# Patient Record
Sex: Male | Born: 1965 | Race: Black or African American | Hispanic: No | Marital: Married | Smoking: Current every day smoker
Health system: Southern US, Community
[De-identification: ages and names within clinical notes are randomized; demographics above are authoritative.]

## PROBLEM LIST (undated history)

## (undated) DIAGNOSIS — I10 Essential (primary) hypertension: Secondary | ICD-10-CM

## (undated) DIAGNOSIS — M549 Dorsalgia, unspecified: Secondary | ICD-10-CM

## (undated) HISTORY — PX: HERNIA REPAIR: SHX51

## (undated) HISTORY — PX: KNEE SURGERY: SHX244

---

## 2017-09-12 ENCOUNTER — Encounter (HOSPITAL_COMMUNITY): Payer: Self-pay | Admitting: Radiology

## 2017-09-12 ENCOUNTER — Emergency Department (HOSPITAL_COMMUNITY): Payer: Medicaid Other

## 2017-09-12 ENCOUNTER — Inpatient Hospital Stay (HOSPITAL_COMMUNITY)
Admission: EM | Admit: 2017-09-12 | Discharge: 2017-09-13 | DRG: 305 | Discharge status: Against Medical Advice | Payer: Medicaid Other | Attending: Internal Medicine | Admitting: Internal Medicine

## 2017-09-12 ENCOUNTER — Other Ambulatory Visit: Payer: Self-pay

## 2017-09-12 ENCOUNTER — Inpatient Hospital Stay (HOSPITAL_COMMUNITY): Payer: Medicaid Other

## 2017-09-12 DIAGNOSIS — R519 Headache, unspecified: Secondary | ICD-10-CM

## 2017-09-12 DIAGNOSIS — F1210 Cannabis abuse, uncomplicated: Secondary | ICD-10-CM | POA: Diagnosis present

## 2017-09-12 DIAGNOSIS — R079 Chest pain, unspecified: Secondary | ICD-10-CM | POA: Diagnosis present

## 2017-09-12 DIAGNOSIS — I160 Hypertensive urgency: Secondary | ICD-10-CM | POA: Diagnosis not present

## 2017-09-12 DIAGNOSIS — I5030 Unspecified diastolic (congestive) heart failure: Secondary | ICD-10-CM

## 2017-09-12 DIAGNOSIS — F1490 Cocaine use, unspecified, uncomplicated: Secondary | ICD-10-CM

## 2017-09-12 DIAGNOSIS — Z87898 Personal history of other specified conditions: Secondary | ICD-10-CM

## 2017-09-12 DIAGNOSIS — I161 Hypertensive emergency: Principal | ICD-10-CM | POA: Diagnosis present

## 2017-09-12 DIAGNOSIS — Z5321 Procedure and treatment not carried out due to patient leaving prior to being seen by health care provider: Secondary | ICD-10-CM | POA: Diagnosis not present

## 2017-09-12 DIAGNOSIS — N200 Calculus of kidney: Secondary | ICD-10-CM | POA: Diagnosis present

## 2017-09-12 DIAGNOSIS — R109 Unspecified abdominal pain: Secondary | ICD-10-CM

## 2017-09-12 DIAGNOSIS — I77819 Aortic ectasia, unspecified site: Secondary | ICD-10-CM

## 2017-09-12 DIAGNOSIS — N179 Acute kidney failure, unspecified: Secondary | ICD-10-CM | POA: Diagnosis present

## 2017-09-12 DIAGNOSIS — F1410 Cocaine abuse, uncomplicated: Secondary | ICD-10-CM | POA: Diagnosis present

## 2017-09-12 DIAGNOSIS — F1290 Cannabis use, unspecified, uncomplicated: Secondary | ICD-10-CM | POA: Diagnosis not present

## 2017-09-12 DIAGNOSIS — R911 Solitary pulmonary nodule: Secondary | ICD-10-CM

## 2017-09-12 DIAGNOSIS — Z801 Family history of malignant neoplasm of trachea, bronchus and lung: Secondary | ICD-10-CM | POA: Diagnosis not present

## 2017-09-12 DIAGNOSIS — F17210 Nicotine dependence, cigarettes, uncomplicated: Secondary | ICD-10-CM | POA: Diagnosis present

## 2017-09-12 DIAGNOSIS — Z720 Tobacco use: Secondary | ICD-10-CM

## 2017-09-12 DIAGNOSIS — I700 Atherosclerosis of aorta: Secondary | ICD-10-CM

## 2017-09-12 DIAGNOSIS — F1910 Other psychoactive substance abuse, uncomplicated: Secondary | ICD-10-CM | POA: Insufficient documentation

## 2017-09-12 DIAGNOSIS — R51 Headache: Secondary | ICD-10-CM

## 2017-09-12 DIAGNOSIS — R072 Precordial pain: Secondary | ICD-10-CM

## 2017-09-12 LAB — URINALYSIS, ROUTINE W REFLEX MICROSCOPIC
BILIRUBIN URINE: NEGATIVE
Bacteria, UA: NONE SEEN
Glucose, UA: NEGATIVE mg/dL
Hgb urine dipstick: NEGATIVE
Ketones, ur: 20 mg/dL — AB
Leukocytes, UA: NEGATIVE
Nitrite: NEGATIVE
PH: 9 — AB (ref 5.0–8.0)
Protein, ur: 30 mg/dL — AB
SPECIFIC GRAVITY, URINE: 1.019 (ref 1.005–1.030)

## 2017-09-12 LAB — COMPREHENSIVE METABOLIC PANEL
ALBUMIN: 4.3 g/dL (ref 3.5–5.0)
ALK PHOS: 58 U/L (ref 38–126)
ALT: 23 U/L (ref 17–63)
ANION GAP: 15 (ref 5–15)
AST: 27 U/L (ref 15–41)
BILIRUBIN TOTAL: 0.8 mg/dL (ref 0.3–1.2)
BUN: 15 mg/dL (ref 6–20)
CALCIUM: 10 mg/dL (ref 8.9–10.3)
CO2: 23 mmol/L (ref 22–32)
Chloride: 104 mmol/L (ref 101–111)
Creatinine, Ser: 1.27 mg/dL — ABNORMAL HIGH (ref 0.61–1.24)
GFR calc Af Amer: >60 mL/min (ref >60)
GLUCOSE: 142 mg/dL — AB (ref 65–99)
Potassium: 3.5 mmol/L (ref 3.5–5.1)
Sodium: 142 mmol/L (ref 135–145)
TOTAL PROTEIN: 7.4 g/dL (ref 6.5–8.1)

## 2017-09-12 LAB — CBC
HCT: 47.9 % (ref 39.0–52.0)
Hemoglobin: 15.8 g/dL (ref 13.0–17.0)
MCH: 27.3 pg (ref 26.0–34.0)
MCHC: 33 g/dL (ref 30.0–36.0)
MCV: 82.9 fL (ref 78.0–100.0)
PLATELETS: 256 10*3/uL (ref 150–400)
RBC: 5.78 MIL/uL (ref 4.22–5.81)
RDW: 14.7 % (ref 11.5–15.5)
WBC: 8.4 10*3/uL (ref 4.0–10.5)

## 2017-09-12 LAB — RAPID URINE DRUG SCREEN, HOSP PERFORMED
Amphetamines: NOT DETECTED
BARBITURATES: NOT DETECTED
BENZODIAZEPINES: NOT DETECTED
COCAINE: POSITIVE — AB
Opiates: NOT DETECTED
TETRAHYDROCANNABINOL: NOT DETECTED

## 2017-09-12 LAB — TROPONIN I
Troponin I: <0.03 ng/mL (ref <0.03)
Troponin I: <0.03 ng/mL (ref <0.03)

## 2017-09-12 LAB — ECHOCARDIOGRAM COMPLETE

## 2017-09-12 LAB — I-STAT TROPONIN, ED: Troponin i, poc: 0.01 ng/mL (ref 0.00–0.08)

## 2017-09-12 MED ORDER — MORPHINE SULFATE (PF) 4 MG/ML IV SOLN
4.0000 mg | Freq: Once | INTRAVENOUS | Status: AC
  Administered 2017-09-12: 4 mg via INTRAVENOUS
  Filled 2017-09-12: qty 1

## 2017-09-12 MED ORDER — NITROGLYCERIN IN D5W 200-5 MCG/ML-% IV SOLN: 0.0000 ug/min | INTRAVENOUS | Status: DC

## 2017-09-12 MED ORDER — ONDANSETRON HCL 4 MG/2ML IJ SOLN
4.0000 mg | Freq: Once | INTRAMUSCULAR | Status: AC
  Administered 2017-09-12: 4 mg via INTRAVENOUS
  Filled 2017-09-12: qty 2

## 2017-09-12 MED ORDER — SODIUM CHLORIDE 0.9 % IV SOLN
INTRAVENOUS | Status: AC
  Administered 2017-09-12: 14:00:00 via INTRAVENOUS

## 2017-09-12 MED ORDER — ACETAMINOPHEN 650 MG RE SUPP: 650.0000 mg | Freq: Four times a day (QID) | RECTAL | Status: DC | PRN

## 2017-09-12 MED ORDER — ACETAMINOPHEN 325 MG PO TABS: 650.0000 mg | ORAL_TABLET | Freq: Four times a day (QID) | ORAL | Status: DC | PRN

## 2017-09-12 MED ORDER — NITROGLYCERIN 0.4 MG SL SUBL: 0.4000 mg | SUBLINGUAL_TABLET | SUBLINGUAL | Status: DC | PRN

## 2017-09-12 MED ORDER — ENOXAPARIN SODIUM 40 MG/0.4ML ~~LOC~~ SOLN
40.0000 mg | SUBCUTANEOUS | Status: DC
  Administered 2017-09-12: 40 mg via SUBCUTANEOUS
  Filled 2017-09-12 (×2): qty 0.4

## 2017-09-12 MED ORDER — DICLOFENAC SODIUM 1 % TD GEL
4.0000 g | Freq: Four times a day (QID) | TRANSDERMAL | Status: DC
  Administered 2017-09-12: 17:00:00 4 g via TOPICAL
  Filled 2017-09-12: qty 100

## 2017-09-12 MED ORDER — KETOROLAC TROMETHAMINE 30 MG/ML IJ SOLN
30.0000 mg | Freq: Four times a day (QID) | INTRAMUSCULAR | Status: DC | PRN
  Administered 2017-09-12: 30 mg via INTRAVENOUS
  Filled 2017-09-12: qty 1

## 2017-09-12 MED ORDER — IOPAMIDOL (ISOVUE-370) INJECTION 76%
INTRAVENOUS | Status: AC
  Administered 2017-09-12: 100 mL
  Filled 2017-09-12: qty 100

## 2017-09-12 MED ORDER — NITROGLYCERIN IN D5W 200-5 MCG/ML-% IV SOLN
0.0000 ug/min | Freq: Once | INTRAVENOUS | Status: AC
  Administered 2017-09-12: 5 ug/min via INTRAVENOUS
  Filled 2017-09-12: qty 250

## 2017-09-12 MED ORDER — POLYETHYLENE GLYCOL 3350 17 G PO PACK
17.0000 g | PACK | Freq: Every day | ORAL | Status: DC | PRN
Start: 2017-09-12 — End: 2017-09-13

## 2017-09-12 MED ORDER — AMLODIPINE BESYLATE 5 MG PO TABS: 10.0000 mg | ORAL_TABLET | Freq: Every day | ORAL | Status: DC

## 2017-09-12 MED ORDER — ONDANSETRON HCL 4 MG/2ML IJ SOLN: 4.0000 mg | Freq: Three times a day (TID) | INTRAMUSCULAR | Status: DC | PRN

## 2017-09-12 NOTE — ED Notes (Signed)
Pt requesting pain medication for his stomach spasms.

## 2017-09-12 NOTE — ED Triage Notes (Signed)
[facility] with complains of SSCP with right side pain and headache with vomiting. Has saline lock left hand. States that he has this pain intermittently-will not answer questions and will not follow commands

## 2017-09-12 NOTE — ED Provider Notes (Signed)
[facility] EMERGENCY DEPARTMENT Provider Note   CSN: [id number] Arrival date & time: 09/12/17  0707     History   Chief Complaint No chief complaint on file.   HPI Jonathon Obrien is a 52 y.o. male.  Patient with history of hypertension presents the emergency department with complaint of acute onset severe mid chest pain, right flank pain, and headache at approximately 1 AM today.  Patient appears distressed and is a vague historian.  He denies any drugs or alcohol, including cocaine.  He does state that he vomited.  No urinary changes.  No treatments prior to arrival.  Does report drinking alcohol about a month ago, denies current use. The course is constant. Aggravating factors: none. Alleviating factors: none.   Additional history from wife who arrived later: Reports they moved from [place] about a year ago. H/o HTN and similar sx in past, hospitalized in [place].       History reviewed. No pertinent past medical history.  There are no active problems to display for this patient.   The histories are not reviewed yet. Please review them in the "History" navigator section and refresh this SmartLink.      Home Medications    Prior to Admission medications   Not on File    Family History No family history on file.  Social History Social History   Tobacco Use  . Smoking status: Not on file  Substance Use Topics  . Alcohol use: Not on file  . Drug use: Not on file     Allergies   Patient has no allergy information on record.   Review of Systems Review of Systems  Constitutional: Negative for diaphoresis and fever.  Eyes: Negative for redness.  Respiratory: Negative for cough and shortness of breath.   Cardiovascular: Positive for chest pain. Negative for palpitations and leg swelling.  Gastrointestinal: Positive for abdominal pain, nausea and vomiting.  Genitourinary: Positive for flank pain. Negative for dysuria.    Musculoskeletal: Negative for back pain and neck pain.  Skin: Negative for rash.  Neurological: Positive for headaches. Negative for syncope and light-headedness.  Psychiatric/Behavioral: The patient is not nervous/anxious.      Physical Exam Updated Vital Signs BP (!) 206/122 (BP Location: Left Arm)   Pulse 86   Resp (!) 22   SpO2 100%   Physical Exam  Constitutional: He appears well-developed and well-nourished. He appears distressed.  Patient appears distressed.  Answers questions with brief responses.  Does not volunteer history.  HENT:  Head: Normocephalic and atraumatic.  Mouth/Throat: Oropharynx is clear and moist and mucous membranes are normal. Mucous membranes are not dry.  Eyes: Conjunctivae are normal.  Neck: Trachea normal and normal range of motion. Neck supple. Normal carotid pulses and no JVD present. No muscular tenderness present. Carotid bruit is not present. No tracheal deviation present.  Cardiovascular: Normal rate, regular rhythm, S1 normal, S2 normal, normal heart sounds and intact distal pulses. Exam reveals no distant heart sounds and no decreased pulses.  No murmur heard. Pulses:      Radial pulses are 2+ on the right side, and 2+ on the left side.       Dorsalis pedis pulses are 2+ on the right side, and 2+ on the left side.  Pulmonary/Chest: Effort normal and breath sounds normal. No respiratory distress. He has no wheezes. He exhibits no tenderness.  Abdominal: Soft. Normal aorta and bowel sounds are normal. There is tenderness (Right flank.). There is no  rebound and no guarding.  Musculoskeletal: He exhibits no edema.  No lower extremity edema.  Neurological: He is alert.  Skin: Skin is warm and dry. He is not diaphoretic. No cyanosis. No pallor.  Psychiatric: He has a normal mood and affect.  Nursing note and vitals reviewed.    ED Treatments / Results  Labs (all labs ordered are listed, but only abnormal results are displayed) Labs Reviewed   COMPREHENSIVE METABOLIC PANEL - Abnormal; Notable for the following components:      Result Value   Glucose, Bld 142 (*)    Creatinine, Ser 1.27 (*)    All other components within normal limits  URINALYSIS, ROUTINE W REFLEX MICROSCOPIC - Abnormal; Notable for the following components:   pH 9.0 (*)    Ketones, ur 20 (*)    Protein, ur 30 (*)    Squamous Epithelial / LPF 0-5 (*)    All other components within normal limits  RAPID URINE DRUG SCREEN, HOSP PERFORMED - Abnormal; Notable for the following components:   Cocaine POSITIVE (*)    All other components within normal limits  CBC  I-STAT TROPONIN, ED    EKG EKG Interpretation  Date/Time:  Saturday September 12 2017 07:16:32 EDT Ventricular Rate:  80 PR Interval:  174 QRS Duration: 102 QT Interval:  420 QTC Calculation: 484 R Axis:   56 Text Interpretation:  Normal sinus rhythm Possible Left atrial enlargement RSR' or QR pattern in V1 suggests right ventricular conduction delay Left ventricular hypertrophy Nonspecific T wave abnormality Prolonged QT Abnormal ECG No old tracing to compare Confirmed by LisbonJacubowitz, Doreatha MartinSam [phone]([id number]) on 09/12/2017 7:48:36 AM  ED ECG REPORT   Date: 09/12/2017  Rate: 73  Rhythm: normal sinus rhythm  QRS Axis: normal  Intervals: normal  ST/T Wave abnormalities: nonspecific ST/T changes  Conduction Disutrbances:none  Narrative Interpretation:   Old EKG Reviewed: unchanged  I have personally reviewed the EKG tracing and agree with the computerized printout as noted.    Radiology Ct Head Wo Contrast  Result Date: 09/12/2017 CLINICAL DATA:  Headache with vomiting. EXAM: CT HEAD WITHOUT CONTRAST TECHNIQUE: Contiguous axial images were obtained from the base of the skull through the vertex without intravenous contrast. COMPARISON:  None. FINDINGS: Brain: No evidence of acute infarction, hemorrhage, hydrocephalus, extra-axial collection or mass lesion/mass effect. Dural calcifications about the bilateral  cerebral convexity, nonspecific. Vascular: No hyperdense vessel or unexpected calcification. Skull: Normal. Negative for fracture or focal lesion. Sinuses/Orbits: No acute finding. IMPRESSION: No acute finding.  No explanation for headache. Electronically Signed   By: Marnee SpringJonathon  Watts M.D.   On: 09/12/2017 09:21   Dg Chest Portable 1 View  Result Date: 09/12/2017 CLINICAL DATA:  Chest pain and flank pain. EXAM: PORTABLE CHEST 1 VIEW COMPARISON:  None. FINDINGS: The study is limited due to patient positioning and a prominent skin fold over lateral left base. No pneumothorax. The heart size borderline. The right hilum is obscured. The left hilum is normal. The mediastinum is unremarkable given technique. No nodules, masses, or focal infiltrates. IMPRESSION: Limited study due to patient positioning. No acute abnormality identified. Electronically Signed   By: Gerome Samavid  Williams III M.D   On: 09/12/2017 08:13   Ct Angio Chest/abd/pel For Dissection W And/or Wo Contrast  Result Date: 09/12/2017 CLINICAL DATA:  Evaluate for aortic dissection. EXAM: CT ANGIOGRAPHY CHEST, ABDOMEN AND PELVIS TECHNIQUE: Multidetector CT imaging through the chest, abdomen and pelvis was performed using the standard protocol during bolus administration of intravenous contrast. Multiplanar  reconstructed images and MIPs were obtained and reviewed to evaluate the vascular anatomy. CONTRAST:  ISOVUE-370 IOPAMIDOL (ISOVUE-370) INJECTION 76% COMPARISON:  None. FINDINGS: CTA CHEST FINDINGS Cardiovascular: The heart size is normal. No pericardial effusion. Mild aortic atherosclerosis. The ascending thoracic aorta measures 3.9 cm. The anterior arch measures 3.2 cm in diameter. Calcification noted within the left circumflex coronary artery. No evidence for aortic dissection. The main pulmonary artery is increased in diameter measuring 3.9 cm. No central obstructing pulmonary embolus identified. Mediastinum/Nodes: No enlarged mediastinal, hilar,  or axillary lymph nodes. Thyroid gland, trachea, and esophagus demonstrate no significant findings. Lungs/Pleura: No pleural effusion. No airspace consolidation, atelectasis or pneumothorax. 3 mm right middle lobe nodule identified, image 87/6. Musculoskeletal: No chest wall abnormality. No acute or significant osseous findings. Review of the MIP images confirms the above findings. CTA ABDOMEN AND PELVIS FINDINGS VASCULAR Aorta: Aortic atherosclerosis identified. No significant branch vessel disease. No aneurysm. No evidence for aortic dissection. Celiac: Patent without evidence of aneurysm, dissection, vasculitis or significant stenosis. SMA: Patent without evidence of aneurysm, dissection, vasculitis or significant stenosis. Renals: Both renal arteries are patent without evidence of aneurysm, dissection, vasculitis, fibromuscular dysplasia or significant stenosis. IMA: Patent without evidence of aneurysm, dissection, vasculitis or significant stenosis. Inflow: Patent without evidence of aneurysm, dissection, vasculitis or significant stenosis. Veins: No obvious venous abnormality within the limitations of this arterial phase study. Review of the MIP images confirms the above findings. NON-VASCULAR Hepatobiliary: Normal appearance of the liver. The gallbladder is unremarkable. No intrahepatic biliary dilatation. Mild increase caliber of the common bile duct which measures up to 9 mm. Pancreas: There is a focal area of relative hypoenhancement at the level of the head of pancreas measuring 2.1 cm, image 36/9. No pancreatic ductal dilatation or inflammation. Spleen: Normal in size without focal abnormality. Adrenals/Urinary Tract: The adrenal glands are unremarkable. Stone within the lower pole of left kidney measures 1 cm. No hydronephrosis identified. Urinary bladder appears normal. Stomach/Bowel: Stomach is within normal limits. No evidence of bowel wall thickening, distention, or inflammatory changes. Lymphatic:  No adenopathy identified within the upper abdomen. There is no pelvic or inguinal adenopathy. Reproductive: Prostate is unremarkable. Other: No free fluid or fluid collections identified. Musculoskeletal: No acute or significant osseous findings. Review of the MIP images confirms the above findings. IMPRESSION: 1. No evidence for aortic dissection. 2. The ascending thoracic aorta is ectatic. The anterior arch of the aorta measures 3.2 cm in diameter. Recommend annual imaging followup by CTA or MRA. This recommendation follows 2010 ACCF/AHA/AATS/ACR/ASA/SCA/SCAI/SIR/STS/SVM Guidelines for the Diagnosis and Management of Patients with Thoracic Aortic Disease. Circulation.2010; 121: U045-W098 3.  Aortic Atherosclerosis (ICD10-I70.0). 4. There is a vague area of low attenuation within the head of pancreas. This is indeterminate. Further investigation with nonemergent pancreas protocol MRI is advised to exclude underlying pancreas neoplasm. 5. Nonobstructing left renal calculus. 6. 3 mm right middle lobe pulmonary nodule. No follow-up needed if patient is low-risk. Non-contrast chest CT can be considered in 12 months if patient is high-risk. This recommendation follows the consensus statement: Guidelines for Management of Incidental Pulmonary Nodules Detected on CT Images: From the Fleischner Society 2017; Radiology 2017; 284:228-243. Electronically Signed   By: Signa Kell M.D.   On: 09/12/2017 09:32    Procedures Procedures (including critical care time)  Medications Ordered in ED Medications  nitroGLYCERIN (NITROSTAT) SL tablet 0.4 mg (has no administration in time range)  nitroGLYCERIN 50 mg in dextrose 5 % 250 mL (0.2 mg/mL) infusion (20  mcg/min Intravenous Rate/Dose Change 09/12/17 0940)  morphine 4 MG/ML injection 4 mg (4 mg Intravenous Given 09/12/17 0806)  ondansetron (ZOFRAN) injection 4 mg (4 mg Intravenous Given 09/12/17 0806)  iopamidol (ISOVUE-370) 76 % injection (100 mLs  Contrast Given 09/12/17  0844)  morphine 4 MG/ML injection 4 mg (4 mg Intravenous Given 09/12/17 1610)     Initial Impression / Assessment and Plan / ED Course  I have reviewed the triage vital signs and the nursing notes.  Pertinent labs & imaging results that were available during my care of the patient were reviewed by me and considered in my medical decision making (see chart for details).     Patient seen and examined. EKG reviewed. Work-up initiated. Medications ordered. Discussed with Dr. Ethelda Chick who has seen patient.   Vital signs reviewed and are as follows: BP (!) 230/120 (BP Location: Left Arm)   Pulse 86   Resp (!) 25   SpO2 100%   Clinically patient has hypertensive emergency.  Given severe headache and vomiting, will need head CT.  Given severe chest and flank pain, will need imaging to rule out dissection.  Patient started on nitroglycerin drip to control blood pressure which is measured at 230/120 manually.  8:21 AM Pt rechecked. Now more comfortable. NTG drip running, BP improving.   Wife now at bedside. She corroborates patient's history.  States that they moved to the area from [place] almost a year ago.  He has not been on any medication since that time but has required medications in the past.  Similar episode of pain and admission to the hospital in [place] in the past year or 2.  Repeat EKG reviewed with Dr. Ethelda Chick.  BP (!) 202/113   Pulse 86   Temp 97.6 F (36.4 C) (Oral)   Resp (!) 27   SpO2 100%    9:17 AM Patient back from CT. UDS + cocaine.   9:45 AM Imaging reassuring. Continues to have CP and flank pain. On NTG drip. Will admit to medicine.   10:07 AM Spoke with IMTS who will see.   CRITICAL CARE Performed by: Carolee Rota Total critical care time: 40 minutes Critical care time was exclusive of separately billable procedures and treating other patients. Critical care was necessary to treat or prevent imminent or life-threatening  deterioration. Critical care was time spent personally by me on the following activities: development of treatment plan with patient and/or surrogate as well as nursing, discussions with consultants, evaluation of patient's response to treatment, examination of patient, obtaining history from patient or surrogate, ordering and performing treatments and interventions, ordering and review of laboratory studies, ordering and review of radiographic studies, pulse oximetry and re-evaluation of patient's condition.    Final Clinical Impressions(s) / ED Diagnoses   Final diagnoses:  Hypertensive emergency  Precordial pain  Flank pain  Acute nonintractable headache, unspecified headache type  Cocaine use   Admit CP/flank pain in setting of uncontrolled HTN. No dissection or hemorrhage.    ED Discharge Orders    None       Renne Crigler, Cordelia Poche 09/12/17 1010    Doug Sou, MD 09/12/17 1205
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
## 2017-09-12 NOTE — ED Notes (Signed)
PT requesting pain med for back pain./  St's previous med did not help

## 2017-09-12 NOTE — Progress Notes (Signed)
Discussed with RN; nitro drip for hypertensive urgency. Cardiac r/o complete; EKG discussed with cards (Dr. Evelene CroonSantos discussed with Dr. Mayford Knifeurner) who did not find it consistent for STEMI; troponins negative x3 so not NSTEMI either.   Discussed to titrate off of nitro drip with goal SBP 160 as his initial SBP 230 so goal decrease of ~30% in 1 day to prevent decreased cerebral perfusion; he is currently at 112/67. He was already started on amlodipine earlier today to help facilitate titrating off of drip. If he needs further antihypertensives PO once off of nitro drip, please call the below pager.  Nyra MarketGorica Jostin Rue, MD IMTS - PGY2 Pager [phone]

## 2017-09-12 NOTE — ED Provider Notes (Signed)
Patient is vague historian complains of anterior chest pain and right flank pain onset 1 AM today.  Also complains of headache and nausea.  He does admit to history of hypertension.  Nothing makes symptoms better or worse.  Symptoms constant.  He denies drug use.  He admits to alcohol use last drank alcohol 1 month ago.  On exam ill-appearing.  Alert follow simple commands, moves all extremities.  HEENT exam no facial asymmetry neck supple lungs clear to auscultation heart regular rate and rhythm abdomen nondistended nontender.  No flank tenderness all 4 extremities without redness swelling or tenderness neurovascular intact peer genitalia normal male   Doug SouJacubowitz, Aerin Delany, MD 09/12/17 [phone]

## 2017-09-12 NOTE — ED Notes (Signed)
Put patient on 2 liters of O2. Patient resting with family at bedside.

## 2017-09-12 NOTE — Progress Notes (Signed)
  Echocardiogram 2D Echocardiogram has been performed.  Delcie RochENNINGTON, Hetal Proano 09/12/2017, 12:42 PM

## 2017-09-12 NOTE — ED Notes (Signed)
Meal tray ordered for pt  

## 2017-09-13 ENCOUNTER — Other Ambulatory Visit: Payer: Self-pay

## 2017-09-13 ENCOUNTER — Encounter (HOSPITAL_COMMUNITY): Payer: Self-pay | Admitting: *Deleted

## 2017-09-13 ENCOUNTER — Emergency Department (HOSPITAL_COMMUNITY)
Admission: EM | Admit: 2017-09-13 | Discharge: 2017-09-13 | Disposition: A | Discharge status: Home / Self Care | Payer: Medicaid Other | Attending: Emergency Medicine | Admitting: Emergency Medicine

## 2017-09-13 DIAGNOSIS — Z5321 Procedure and treatment not carried out due to patient leaving prior to being seen by health care provider: Secondary | ICD-10-CM | POA: Insufficient documentation

## 2017-09-13 DIAGNOSIS — R079 Chest pain, unspecified: Secondary | ICD-10-CM | POA: Insufficient documentation

## 2017-09-13 HISTORY — DX: Essential (primary) hypertension: I10

## 2017-09-13 NOTE — ED Notes (Signed)
Admitting MD paged ref. Back pain

## 2017-09-13 NOTE — ED Triage Notes (Signed)
Pt states he was here on Saturday and then was to be admitted. A physician called him to come back in to be admitted

## 2017-09-13 NOTE — ED Notes (Signed)
This RN has spoken with patient multiple times since returning to ED this am. Patient states that he left last night because there were no rooms in the hospital and appears frustrated with the delay. I had patient re-admitted to ED and facilitated to treatment room. After multiple apologizes patient left ED and stated he would go to different hospital

## 2017-09-13 NOTE — ED Notes (Signed)
Explained to pt that he would be moving to another room in the ED until his admission bed was assigned.  Pt st's he is not moving.  Charge Nurse Maryagnes AmosEmily Cline, RN made aware

## 2017-09-13 NOTE — ED Notes (Signed)
Received return call from admitting MD, no new orders received at this time.

## 2017-09-13 NOTE — Discharge Summary (Signed)
Name: Jonathon Obrien MRN: [id number] DOB: Sep 08, 1965 52 y.o. PCP: Patient, No Pcp Per  Date of Admission: 09/12/2017  7:07 AM Date of Discharge: 09/13/2017 Attending Physician: Gust Rung, DO  Discharge Diagnosis: 1. Hypertensive urgency  2. Chest pain  3. Pulmonary nodule in R middle lobe  4. Anterior aortic arch dilatation  5. Abnormal findings on CT within head of pancreas  6. Polysubstance abuse  7. Aortic sclerosis   Principal Problem:   Hypertensive urgency Active Problems:   Aortic atherosclerosis (HCC)   Pulmonary nodule in R middle lobe   Aortic dilatation (HCC) of anterior arch   Polysubstance abuse (HCC)    Tobacco use   Discharge Medications: Allergies as of 09/13/2017   No Known Allergies     Medication List    ASK your doctor about these medications   calcium carbonate 750 MG chewable tablet Commonly known as:  TUMS EX Chew 2 tablets by mouth as needed for heartburn.   diphenhydrAMINE 25 MG tablet Commonly known as:  BENADRYL Take 25 mg by mouth every 6 (six) hours as needed for allergies.   multivitamin with minerals Tabs tablet Take 1 tablet by mouth daily.   naproxen sodium 220 MG tablet Commonly known as:  ALEVE Take 440 mg by mouth as needed (pain).       Disposition and follow-up:   Mr.Jonathon Obrien from [facility].  At the hospital follow up visit please address:  1.  Please assess patient blood pressure as he presented with hypertensive urgency. We recommend starting treatment for HTN which is likely a chronic issue given severe LVH on echo. Please assess for ongoing chest pain. Anterior aortic arch dilation noted on CTA chest and patient will need annual imaging follow up by CTA or MRA. There is a 3mm RML pulmonary nodule noted on CTA chest. Given extensive history of smoking and current tobacco use as well as family history of lung cancer, patient will need non-contrast chest CT in 12 months as he is high  risk. Last, low attenuation within head of pancreas also noted on imaging and will need further investigation with nonemergent pancreas protocol MRI to exclude underlying pancreas neoplasm.  2.  Labs / imaging needed at time of follow-up: BMP to check renal function, EKG   3.  Pending labs/ test needing follow-up: None   Follow-up Appointments: None, patient left [facility]    Hospital Course by problem list: Principal Problem:   Hypertensive urgency Active Problems:   Aortic atherosclerosis (HCC)   Pulmonary nodule in R middle lobe   Aortic dilatation (HCC) of anterior arch   Polysubstance abuse (HCC)    Tobacco use   Of note, patient ELOPED from the ED after being admitted on 3/30 by the Internal Medicine Teaching Service. We were unable to locate the patient on 3/31. During admission, patient was educated on dangers of elevated blood pressure and goal of slow, steady decrease with IV medications to avoid risk of stroke. Attempted to call patient x3 to discuss workup results as he has no PCP listed in the system but unable to reach him.   1. Hypertensive urgency: Patient presented with HA, N/V, chest pain and dyspnea  and found to have a BP 230/100s. He has a history of HTN but does not take any medications at home and does not follow up with a health care provider. He also has a history of polysubstance abuse including cocaine which he tested positive for during this  admission.  He was started on a nitroglycerin in the ED with improvement in blood pressure. He was also started on amlodipine 10 mg QD. Patient self-removed peripheral IV lines and left the ED against medical advice.    2. Chest pain: Patient presented complaining of substernal and R-sided chest pain x 12 hours as well as dyspnea, N/V though no clear association between symptoms. His EKG showed minimal ST elevations on V2 and V3 but troponin negative x2. Cardiology consulted who stated low suspicion for active ischemia in the setting  of negative cardiac enzymes and recommended echocardiogram to assess LV function. TTE showed EF 60%, G1DD, LAE, and severe LVH (full report below). This is likely secondary to chronic, uncontrolled HTN and he will be greatly benefit from outpatient treatment.  This was thought to be secondary to coronary vasospasms in the setting of cocaine use which he tested positive for during this admission (UDS result below), though patient denied this.  3. Pulmonary nodule in R middle lobe: This was found on CTA chest on admission. We recommend follow up in 12 months with non-contrast chest CT as patient is at high risk for malignancy given extensive history of smoking, current tobacco use, and family history of lung cancer.   4. Anterior aortic arch dilatation: Noted on CTA chest on admission. He will need annual imaging follow up by CTA or MRA.  5. Abnormal findings on CT within head of pancreas: This was also noted on imaging and will need further investigation with nonemergent pancreas protocol MRI to exclude underlying pancreas neoplasm.  6. Polysubstance abuse: Patient has a long history of polysubstance abuse including marijuana, cocaine, and heroin.  He reports he last used illicit drugs in his teenage years however, per chart it appears substance use was last documented as of 2017.  His UDS was positive for cocaine, which patient denied using.  7. Aortic sclerosis: Noted on CTA chest during this admission (full report below).    Discharge Vitals:   BP 112/79   Pulse 75   Temp 97.6 F (36.4 C) (Oral)   Resp (!) 7   Ht 6\' 5"  (1.956 m)   Wt 200 lb (90.7 kg)   SpO2 98%   BMI 23.72 kg/m   Pertinent Labs, Studies, and Procedures:   CBC Latest Ref Rng & Units 09/12/2017  WBC 4.0 - 10.5 K/uL 8.4  Hemoglobin 13.0 - 17.0 g/dL 74.2  Hematocrit 59.5 - 52.0 % 47.9  Platelets 150 - 400 K/uL 256   CMP Latest Ref Rng & Units 09/12/2017  Glucose 65 - 99 mg/dL 638(V)  BUN 6 - 20 mg/dL 15  Creatinine  5.64 - 1.24 mg/dL 3.32(R)  Sodium 518 - 841 mmol/L 142  Potassium 3.5 - 5.1 mmol/L 3.5  Chloride 101 - 111 mmol/L 104  CO2 22 - 32 mmol/L 23  Calcium 8.9 - 10.3 mg/dL 66.0  Total Protein 6.5 - 8.1 g/dL 7.4  Total Bilirubin 0.3 - 1.2 mg/dL 0.8  Alkaline Phos 38 - 126 U/L 58  AST 15 - 41 U/L 27  ALT 17 - 63 U/L 23    Drugs of Abuse     Component Value Date/Time   LABOPIA NONE DETECTED 09/12/2017 0755   COCAINSCRNUR POSITIVE (A) 09/12/2017 0755   LABBENZ NONE DETECTED 09/12/2017 0755   AMPHETMU NONE DETECTED 09/12/2017 0755   THCU NONE DETECTED 09/12/2017 0755   LABBARB NONE DETECTED 09/12/2017 0755    Urinalysis    Component Value Date/Time   COLORURINE  YELLOW 09/12/2017 0755   APPEARANCEUR CLEAR 09/12/2017 0755   LABSPEC 1.019 09/12/2017 0755   PHURINE 9.0 (H) 09/12/2017 0755   GLUCOSEU NEGATIVE 09/12/2017 0755   HGBUR NEGATIVE 09/12/2017 0755   BILIRUBINUR NEGATIVE 09/12/2017 0755   KETONESUR 20 (A) 09/12/2017 0755   PROTEINUR 30 (A) 09/12/2017 0755   NITRITE NEGATIVE 09/12/2017 0755   LEUKOCYTESUR NEGATIVE 09/12/2017 0755    Troponin negative x 3  CXR 3/30: FINDINGS: The study is limited due to patient positioning and a prominent skin fold over lateral left base. No pneumothorax. The heart size borderline. The right hilum is obscured. The left hilum is normal. The mediastinum is unremarkable given technique. No nodules, masses, or focal infiltrates.  Head CT 3/30: FINDINGS: Brain: No evidence of acute infarction, hemorrhage, hydrocephalus, extra-axial collection or mass lesion/mass effect. Dural calcifications about the bilateral cerebral convexity, nonspecific.  CTA chest/abd/pelvis 3/30: IMPRESSION: 1. No evidence for aortic dissection. 2. The ascending thoracic aorta is ectatic. The anterior arch of the aorta measures 3.2 cm in diameter. Recommend annual imaging followup by CTA or MRA. This recommendation follows  2010 ACCF/AHA/AATS/ACR/ASA/SCA/SCAI/SIR/STS/SVM Guidelines for the Diagnosis and Management of Patients with Thoracic Aortic Disease. Circulation.2010; 121: V409-[id number] 3.  Aortic Atherosclerosis (ICD10-I70.0). 4. There is a vague area of low attenuation within the head of pancreas. This is indeterminate. Further investigation with nonemergent pancreas protocol MRI is advised to exclude underlying pancreas neoplasm. 5. Nonobstructing left renal calculus. 6. 3 mm right middle lobe pulmonary nodule. No follow-up needed if patient is low-risk. Non-contrast chest CT can be considered in 12 months if patient is high-risk. This recommendation follows the consensus statement: Guidelines for Management of Incidental Pulmonary Nodules Detected on CT Images: From the Fleischner Society 2017; Radiology 2017; 284:228-243.  TTE 3/30: Study Conclusions - Left ventricle: The cavity size was normal. There was severe   concentric hypertrophy. Systolic function was normal. The   estimated ejection fraction was in the range of 60% to 65%.   Doppler parameters are consistent with abnormal left ventricular   relaxation (grade 1 diastolic dysfunction). The E/e&' ratio is   between 8-15, suggesting indeterminate LV filling pressure. - Aorta: Aortic root dimension: 39 mm (ED). - Ascending aorta: The ascending aorta was mildly dilated. - Mitral valve: Mildly thickened leaflets . There was trivial   regurgitation. - Left atrium: Mildly dilated. - Inferior vena cava: The vessel was normal in size. The   respirophasic diameter changes were in the normal range (>= 50%),   consistent with normal central venous pressure.  Impressions: - LVEF 60-65%, severe LVH, normal wall motion, grade 1 DD,   indeterminate LV filling pressure, dilated aorta to 3.9 cm,   trivial MR, mild LAE, normal IVC.    Discharge Instructions: No discharge instructions provided as patient left AMA before a health provider could discuss  these with him.    SignedBurna Cash: Santos-Sanchez, Pastor Sgro, MD 09/13/2017, 1:31 PM   Pager: [phone]

## 2017-10-27 ENCOUNTER — Other Ambulatory Visit: Payer: Self-pay

## 2017-10-27 ENCOUNTER — Encounter (HOSPITAL_COMMUNITY): Payer: Self-pay | Admitting: *Deleted

## 2017-10-27 ENCOUNTER — Inpatient Hospital Stay (HOSPITAL_COMMUNITY)
Admission: EM | Admit: 2017-10-27 | Discharge: 2017-11-02 | DRG: 419 | Disposition: A | Discharge status: Home / Self Care | Payer: Medicaid Other | Attending: Family Medicine | Admitting: Family Medicine

## 2017-10-27 ENCOUNTER — Emergency Department (HOSPITAL_COMMUNITY): Payer: Medicaid Other

## 2017-10-27 DIAGNOSIS — F17200 Nicotine dependence, unspecified, uncomplicated: Secondary | ICD-10-CM | POA: Diagnosis present

## 2017-10-27 DIAGNOSIS — R1011 Right upper quadrant pain: Secondary | ICD-10-CM

## 2017-10-27 DIAGNOSIS — F1910 Other psychoactive substance abuse, uncomplicated: Secondary | ICD-10-CM | POA: Diagnosis present

## 2017-10-27 DIAGNOSIS — K5900 Constipation, unspecified: Secondary | ICD-10-CM | POA: Diagnosis present

## 2017-10-27 DIAGNOSIS — Z720 Tobacco use: Secondary | ICD-10-CM | POA: Diagnosis present

## 2017-10-27 DIAGNOSIS — R079 Chest pain, unspecified: Secondary | ICD-10-CM | POA: Diagnosis present

## 2017-10-27 DIAGNOSIS — E860 Dehydration: Secondary | ICD-10-CM | POA: Diagnosis present

## 2017-10-27 DIAGNOSIS — K810 Acute cholecystitis: Secondary | ICD-10-CM | POA: Diagnosis present

## 2017-10-27 DIAGNOSIS — I10 Essential (primary) hypertension: Secondary | ICD-10-CM | POA: Diagnosis present

## 2017-10-27 DIAGNOSIS — K8020 Calculus of gallbladder without cholecystitis without obstruction: Secondary | ICD-10-CM | POA: Diagnosis present

## 2017-10-27 DIAGNOSIS — F1410 Cocaine abuse, uncomplicated: Secondary | ICD-10-CM | POA: Diagnosis present

## 2017-10-27 DIAGNOSIS — K8000 Calculus of gallbladder with acute cholecystitis without obstruction: Principal | ICD-10-CM | POA: Diagnosis present

## 2017-10-27 HISTORY — DX: Dorsalgia, unspecified: M54.9

## 2017-10-27 LAB — URINALYSIS, ROUTINE W REFLEX MICROSCOPIC
BILIRUBIN URINE: NEGATIVE
Bacteria, UA: NONE SEEN
Glucose, UA: NEGATIVE mg/dL
KETONES UR: 80 mg/dL — AB
LEUKOCYTES UA: NEGATIVE
Nitrite: NEGATIVE
PH: 8 (ref 5.0–8.0)
Protein, ur: 100 mg/dL — AB
SPECIFIC GRAVITY, URINE: 1.017 (ref 1.005–1.030)

## 2017-10-27 LAB — BASIC METABOLIC PANEL
ANION GAP: 15 (ref 5–15)
BUN: 7 mg/dL (ref 6–20)
CHLORIDE: 97 mmol/L — AB (ref 101–111)
CO2: 28 mmol/L (ref 22–32)
Calcium: 10.3 mg/dL (ref 8.9–10.3)
Creatinine, Ser: 1.27 mg/dL — ABNORMAL HIGH (ref 0.61–1.24)
Glucose, Bld: 121 mg/dL — ABNORMAL HIGH (ref 65–99)
Potassium: 3.8 mmol/L (ref 3.5–5.1)
SODIUM: 140 mmol/L (ref 135–145)

## 2017-10-27 LAB — CBC
HEMATOCRIT: 57.1 % — AB (ref 39.0–52.0)
Hemoglobin: 17.9 g/dL — ABNORMAL HIGH (ref 13.0–17.0)
MCH: 26.1 pg (ref 26.0–34.0)
MCHC: 31.3 g/dL (ref 30.0–36.0)
MCV: 83.1 fL (ref 78.0–100.0)
Platelets: 222 10*3/uL (ref 150–400)
RBC: 6.87 MIL/uL — ABNORMAL HIGH (ref 4.22–5.81)
RDW: 15.6 % — ABNORMAL HIGH (ref 11.5–15.5)
WBC: 9.8 10*3/uL (ref 4.0–10.5)

## 2017-10-27 LAB — LIPASE, BLOOD: LIPASE: 23 U/L (ref 11–51)

## 2017-10-27 LAB — I-STAT TROPONIN, ED: Troponin i, poc: 0 ng/mL (ref 0.00–0.08)

## 2017-10-27 NOTE — ED Triage Notes (Signed)
Pt reports onset of burning right sided abdominal pain and vomiting that started last night (after eating), with onset of sharp right chest pain. Last ibuprofen at 1600.  Pt unsure of fevers.

## 2017-10-28 ENCOUNTER — Inpatient Hospital Stay (HOSPITAL_COMMUNITY): Payer: Medicaid Other

## 2017-10-28 ENCOUNTER — Emergency Department (HOSPITAL_COMMUNITY): Payer: Medicaid Other

## 2017-10-28 ENCOUNTER — Encounter (HOSPITAL_COMMUNITY): Payer: Self-pay | Admitting: Radiology

## 2017-10-28 DIAGNOSIS — R109 Unspecified abdominal pain: Secondary | ICD-10-CM

## 2017-10-28 DIAGNOSIS — K810 Acute cholecystitis: Secondary | ICD-10-CM | POA: Diagnosis not present

## 2017-10-28 DIAGNOSIS — R1011 Right upper quadrant pain: Secondary | ICD-10-CM

## 2017-10-28 DIAGNOSIS — K8020 Calculus of gallbladder without cholecystitis without obstruction: Secondary | ICD-10-CM | POA: Diagnosis not present

## 2017-10-28 DIAGNOSIS — F1410 Cocaine abuse, uncomplicated: Secondary | ICD-10-CM | POA: Diagnosis present

## 2017-10-28 DIAGNOSIS — I10 Essential (primary) hypertension: Secondary | ICD-10-CM

## 2017-10-28 DIAGNOSIS — K8000 Calculus of gallbladder with acute cholecystitis without obstruction: Secondary | ICD-10-CM | POA: Diagnosis present

## 2017-10-28 DIAGNOSIS — F17200 Nicotine dependence, unspecified, uncomplicated: Secondary | ICD-10-CM | POA: Diagnosis present

## 2017-10-28 DIAGNOSIS — Z720 Tobacco use: Secondary | ICD-10-CM | POA: Diagnosis not present

## 2017-10-28 DIAGNOSIS — F1910 Other psychoactive substance abuse, uncomplicated: Secondary | ICD-10-CM

## 2017-10-28 DIAGNOSIS — K5900 Constipation, unspecified: Secondary | ICD-10-CM | POA: Diagnosis present

## 2017-10-28 DIAGNOSIS — E860 Dehydration: Secondary | ICD-10-CM | POA: Diagnosis present

## 2017-10-28 DIAGNOSIS — R079 Chest pain, unspecified: Secondary | ICD-10-CM | POA: Diagnosis present

## 2017-10-28 LAB — TROPONIN I
Troponin I: <0.03 ng/mL (ref <0.03)
Troponin I: <0.03 ng/mL (ref <0.03)

## 2017-10-28 LAB — HEPATIC FUNCTION PANEL
ALBUMIN: 5.1 g/dL — AB (ref 3.5–5.0)
ALK PHOS: 71 U/L (ref 38–126)
ALT: 18 U/L (ref 17–63)
AST: 27 U/L (ref 15–41)
BILIRUBIN INDIRECT: 0.6 mg/dL (ref 0.3–0.9)
BILIRUBIN TOTAL: 0.8 mg/dL (ref 0.3–1.2)
Bilirubin, Direct: 0.2 mg/dL (ref 0.1–0.5)
TOTAL PROTEIN: 8.8 g/dL — AB (ref 6.5–8.1)

## 2017-10-28 LAB — RAPID URINE DRUG SCREEN, HOSP PERFORMED
AMPHETAMINES: NOT DETECTED
BARBITURATES: NOT DETECTED
Benzodiazepines: NOT DETECTED
COCAINE: POSITIVE — AB
OPIATES: NOT DETECTED
TETRAHYDROCANNABINOL: NOT DETECTED

## 2017-10-28 LAB — CBG MONITORING, ED: GLUCOSE-CAPILLARY: 87 mg/dL (ref 65–99)

## 2017-10-28 LAB — HIV ANTIBODY (ROUTINE TESTING W REFLEX): HIV Screen 4th Generation wRfx: NONREACTIVE

## 2017-10-28 MED ORDER — ONDANSETRON HCL 4 MG/2ML IJ SOLN
4.0000 mg | Freq: Once | INTRAMUSCULAR | Status: AC
  Administered 2017-10-28: 4 mg via INTRAVENOUS
  Filled 2017-10-28: qty 2

## 2017-10-28 MED ORDER — TECHNETIUM TC 99M MEBROFENIN IV KIT
5.0000 | PACK | Freq: Once | INTRAVENOUS | Status: AC | PRN
  Administered 2017-10-28: 5 via INTRAVENOUS

## 2017-10-28 MED ORDER — CALCIUM CARBONATE ANTACID 500 MG PO CHEW
3.0000 | CHEWABLE_TABLET | ORAL | Status: DC | PRN
  Filled 2017-10-28: qty 3

## 2017-10-28 MED ORDER — LISINOPRIL 20 MG PO TABS
20.0000 mg | ORAL_TABLET | Freq: Every day | ORAL | Status: DC
  Administered 2017-10-28 – 2017-11-01 (×4): 20 mg via ORAL
  Filled 2017-10-28 (×4): qty 1

## 2017-10-28 MED ORDER — SODIUM CHLORIDE 0.9 % IV BOLUS
1000.0000 mL | Freq: Once | INTRAVENOUS | Status: AC
  Administered 2017-10-28: 1000 mL via INTRAVENOUS

## 2017-10-28 MED ORDER — ADULT MULTIVITAMIN W/MINERALS CH
1.0000 | ORAL_TABLET | Freq: Every day | ORAL | Status: DC
  Administered 2017-10-28 – 2017-11-01 (×4): 1 via ORAL
  Filled 2017-10-28 (×4): qty 1

## 2017-10-28 MED ORDER — FAMOTIDINE IN NACL 20-0.9 MG/50ML-% IV SOLN
20.0000 mg | Freq: Two times a day (BID) | INTRAVENOUS | Status: DC
Start: 2017-10-28 — End: 2017-11-02
  Administered 2017-10-28 – 2017-11-01 (×9): 20 mg via INTRAVENOUS
  Filled 2017-10-28 (×9): qty 50

## 2017-10-28 MED ORDER — ONDANSETRON HCL 4 MG/2ML IJ SOLN: 4.0000 mg | Freq: Three times a day (TID) | INTRAMUSCULAR | Status: DC | PRN

## 2017-10-28 MED ORDER — ASPIRIN EC 325 MG PO TBEC
325.0000 mg | DELAYED_RELEASE_TABLET | Freq: Every day | ORAL | Status: DC
  Administered 2017-10-28 – 2017-10-29 (×2): 325 mg via ORAL
  Filled 2017-10-28 (×2): qty 1

## 2017-10-28 MED ORDER — SODIUM CHLORIDE 0.9 % IV SOLN
INTRAVENOUS | Status: DC
  Administered 2017-10-28 – 2017-11-01 (×6): via INTRAVENOUS

## 2017-10-28 MED ORDER — NICOTINE 21 MG/24HR TD PT24
21.0000 mg | MEDICATED_PATCH | Freq: Every day | TRANSDERMAL | Status: DC
  Administered 2017-10-28 – 2017-11-01 (×5): 21 mg via TRANSDERMAL
  Filled 2017-10-28 (×6): qty 1

## 2017-10-28 MED ORDER — KETOROLAC TROMETHAMINE 30 MG/ML IJ SOLN
30.0000 mg | Freq: Four times a day (QID) | INTRAMUSCULAR | Status: AC | PRN
  Administered 2017-10-28 (×2): 30 mg via INTRAVENOUS
  Filled 2017-10-28 (×3): qty 1

## 2017-10-28 MED ORDER — IOHEXOL 300 MG/ML  SOLN
100.0000 mL | Freq: Once | INTRAMUSCULAR | Status: AC | PRN
  Administered 2017-10-28: 100 mL via INTRAVENOUS

## 2017-10-28 MED ORDER — MORPHINE SULFATE (PF) 4 MG/ML IV SOLN
4.0000 mg | Freq: Once | INTRAVENOUS | Status: AC
  Administered 2017-10-28: 4 mg via INTRAVENOUS
  Filled 2017-10-28: qty 1

## 2017-10-28 MED ORDER — ZOLPIDEM TARTRATE 5 MG PO TABS: 5.0000 mg | ORAL_TABLET | Freq: Every evening | ORAL | Status: DC | PRN

## 2017-10-28 MED ORDER — HYDROMORPHONE HCL 2 MG/ML IJ SOLN
1.0000 mg | Freq: Once | INTRAMUSCULAR | Status: AC
  Administered 2017-10-28: 1 mg via INTRAVENOUS
  Filled 2017-10-28: qty 1

## 2017-10-28 MED ORDER — POLYETHYLENE GLYCOL 3350 17 G PO PACK: 17.0000 g | PACK | Freq: Every day | ORAL | Status: DC | PRN

## 2017-10-28 MED ORDER — NITROGLYCERIN 0.4 MG SL SUBL: 0.4000 mg | SUBLINGUAL_TABLET | SUBLINGUAL | Status: DC | PRN

## 2017-10-28 MED ORDER — ACETAMINOPHEN 325 MG PO TABS
650.0000 mg | ORAL_TABLET | Freq: Four times a day (QID) | ORAL | Status: DC | PRN
  Administered 2017-10-28 – 2017-10-29 (×2): 650 mg via ORAL
  Filled 2017-10-28 (×2): qty 2

## 2017-10-28 MED ORDER — DIPHENHYDRAMINE HCL 25 MG PO CAPS: 25.0000 mg | ORAL_CAPSULE | Freq: Four times a day (QID) | ORAL | Status: DC | PRN

## 2017-10-28 MED ORDER — HYDRALAZINE HCL 20 MG/ML IJ SOLN: 5.0000 mg | INTRAMUSCULAR | Status: DC | PRN

## 2017-10-28 NOTE — Consult Note (Signed)
[facility] Consult Note  Tricia Oaxaca 05-10-1966  [id number].    Requesting MD: Vance Gather Chief Complaint/Reason for Consult: RUQ pain  HPI:  Jonathon Obrien is a 52yo male PMH HTN and PSA who presented to [facility] last night with 3 days of RUQ pain. Patient states that it started after eating meatballs. Pain is RUQ and in his right chest. It radiates down his entire right side. Constant and severe. He tried taking advil but this did not help. Pain is associated with fever up to 101, nausea, vomiting, and constipation. Last BM 5/12, typically has 1 BM daily. States that he has had similar but less severe pain in the past, about 4 episodes over the last 1-2 months.  In the ED patient was found to have WBC 9.8, lipase 23, AST 27, ALT 18, alk phos 71, bilirubin 0.8, negative troponins. CT scan showed dilatation of the common bile duct to 9 mm in diameter, with mild intrahepatic biliary ductal dilatation. U/s shows diffuse gallbladder wall thickening and associated pericholecystic fluid without stones. HIDA scan pending. General surgery asked to see.  PMH significant for HTN, PSA (last used cocaine 5/12) Abdominal surgical history: bilateral inguinal hernia repair 2013 in [place] and 2015 in [place] Anticoagulants: none Smokes up to 2 PPD Drinks 3-4 24oz cans beer +/- 2-3 bottles of wine daily Currently unemployed   ROS: Review of Systems  Constitutional: Positive for fever.  HENT: Negative.   Eyes: Negative.   Respiratory: Negative.   Cardiovascular: Positive for chest pain.  Gastrointestinal: Positive for abdominal pain, constipation, nausea and vomiting. Negative for diarrhea.  Genitourinary: Negative.   Musculoskeletal: Negative.   Skin: Negative.   Neurological: Positive for headaches.    All systems reviewed and otherwise negative except for as above  Family History  Problem Relation Age of Onset  . Hypertension Mother   . Hypertension Father   . Diabetes Mellitus II  Father     Past Medical History:  Diagnosis Date  . Back pain   . Hypertension     Past Surgical History:  Procedure Laterality Date  . HERNIA REPAIR    . KNEE SURGERY      Social History:  reports that he has been smoking.  He has never used smokeless tobacco. He reports that he has current or past drug history. Drug: Cocaine. He reports that he does not drink alcohol.  Allergies: No Known Allergies   (Not in a hospital admission)  Prior to Admission medications   Medication Sig Start Date End Date Taking? Authorizing Provider  calcium carbonate (TUMS EX) 750 MG chewable tablet Chew 2 tablets by mouth as needed for heartburn.   Yes [provider]  diphenhydrAMINE (BENADRYL) 25 MG tablet Take 25 mg by mouth every 6 (six) hours as needed for allergies.   Yes [provider]  lisinopril (PRINIVIL,ZESTRIL) 20 MG tablet Take 20 mg by mouth daily.   Yes [provider]  Multiple Vitamin (MULTIVITAMIN WITH MINERALS) TABS tablet Take 1 tablet by mouth daily.   Yes [provider]  naproxen sodium (ALEVE) 220 MG tablet Take 440 mg by mouth daily as needed (pain).    Yes [provider]    Blood pressure 128/87, pulse 70, temperature 99 F (37.2 C), temperature source Oral, resp. rate 14, height '6\' 5"'$  (1.956 m), SpO2 100 %. Physical Exam: General: WD/WN AA male who is laying in bed in NAD HEENT: head is normocephalic, atraumatic.  Sclera are noninjected.  Pupils  equal and round.  Ears and nose without any masses or lesions.  Mouth is pink and moist. Dentition fair Heart: regular, rate, and rhythm.  No obvious murmurs, gallops, or rubs noted.  Palpable pedal pulses bilaterally Lungs: CTAB, no wheezes, rhonchi, or rales noted.  Respiratory effort nonlabored Abd: soft, ND, +BS, no masses, hernias, or organomegaly. TTP RUQ>RLQ without rebound or guarding. Negative murphy sign MS: all 4 extremities are symmetrical with no cyanosis, clubbing, or  edema. Skin: warm and dry with no masses, lesions, or rashes Psych: A&Ox3 with an appropriate affect. Neuro: cranial nerves grossly intact, extremity CSM intact bilaterally, normal speech  Results for orders placed or performed during the hospital encounter of 10/27/17 (from the past 48 hour(s))  Basic metabolic panel     Status: Abnormal   Collection Time: 10/27/17 10:18 PM  Result Value Ref Range   Sodium 140 135 - 145 mmol/L   Potassium 3.8 3.5 - 5.1 mmol/L   Chloride 97 (L) 101 - 111 mmol/L   CO2 28 22 - 32 mmol/L   Glucose, Bld 121 (H) 65 - 99 mg/dL   BUN 7 6 - 20 mg/dL   Creatinine, Ser 1.27 (H) 0.61 - 1.24 mg/dL   Calcium 10.3 8.9 - 10.3 mg/dL   GFR calc non Af Amer >60 >60 mL/min   GFR calc Af Amer >60 >60 mL/min    Comment: (NOTE) The eGFR has been calculated using the CKD EPI equation. This calculation has not been validated in all clinical situations. eGFR's persistently <60 mL/min signify possible Chronic Kidney Disease.    Anion gap 15 5 - 15    Comment: Performed at [facility] [address], [place], [place] [address]  CBC     Status: Abnormal   Collection Time: 10/27/17 10:18 PM  Result Value Ref Range   WBC 9.8 4.0 - 10.5 K/uL   RBC 6.87 (H) 4.22 - 5.81 MIL/uL   Hemoglobin 17.9 (H) 13.0 - 17.0 g/dL   HCT 57.1 (H) 39.0 - 52.0 %   MCV 83.1 78.0 - 100.0 fL   MCH 26.1 26.0 - 34.0 pg   MCHC 31.3 30.0 - 36.0 g/dL   RDW 15.6 (H) 11.5 - 15.5 %   Platelets 222 150 - 400 K/uL    Comment: Performed at [facility] [address], [place], [place] [address]  Lipase, blood     Status: None   Collection Time: 10/27/17 10:18 PM  Result Value Ref Range   Lipase 23 11 - 51 U/L    Comment: Performed at [facility] [address] [place], [place] [address]  Hepatic function panel     Status: Abnormal   Collection Time: 10/27/17 10:18 PM  Result Value Ref Range   Total Protein 8.8 (H) 6.5 - 8.1 g/dL   Albumin 5.1 (H) 3.5 - 5.0 g/dL   AST 27 15 -  41 U/L   ALT 18 17 - 63 U/L   Alkaline Phosphatase 71 38 - 126 U/L   Total Bilirubin 0.8 0.3 - 1.2 mg/dL   Bilirubin, Direct 0.2 0.1 - 0.5 mg/dL   Indirect Bilirubin 0.6 0.3 - 0.9 mg/dL    Comment: Performed at [facility] [address] [place], [place] [address]  Urinalysis, Routine w reflex microscopic     Status: Abnormal   Collection Time: 10/27/17 10:25 PM  Result Value Ref Range   Color, Urine YELLOW YELLOW   APPearance HAZY (  A) CLEAR   Specific Gravity, Urine 1.017 1.005 - 1.030   pH 8.0 5.0 - 8.0   Glucose, UA NEGATIVE NEGATIVE mg/dL   Hgb urine dipstick SMALL (A) NEGATIVE   Bilirubin Urine NEGATIVE NEGATIVE   Ketones, ur 80 (A) NEGATIVE mg/dL   Protein, ur 100 (A) NEGATIVE mg/dL   Nitrite NEGATIVE NEGATIVE   Leukocytes, UA NEGATIVE NEGATIVE   RBC / HPF 6-10 0 - 5 RBC/hpf   WBC, UA 0-5 0 - 5 WBC/hpf   Bacteria, UA NONE SEEN NONE SEEN   Squamous Epithelial / LPF 0-5 0 - 5   Mucus PRESENT     Comment: Performed at [facility], [place] [address], [place], [place] [address]  I-stat troponin, ED     Status: None   Collection Time: 10/27/17 10:34 PM  Result Value Ref Range   Troponin i, poc 0.00 0.00 - 0.08 ng/mL   Comment 3            Comment: Due to the release kinetics of cTnI, a negative result within the first hours of the onset of symptoms does not rule out myocardial infarction with certainty. If myocardial infarction is still suspected, repeat the test at appropriate intervals.    Dg Chest 2 View  Result Date: 10/27/2017 CLINICAL DATA:  Acute onset of right-sided chest pain, generalized abdominal pain, nausea, vomiting and headache. EXAM: CHEST - 2 VIEW COMPARISON:  Chest radiograph and CTA of the chest performed 09/12/2017 FINDINGS: The lungs are well-aerated. Minimal scarring is noted at the left lung apex. There is no evidence of focal opacification, pleural effusion or pneumothorax. The heart is normal in size; the mediastinal contour is within  normal limits. No acute osseous abnormalities are seen. IMPRESSION: No acute cardiopulmonary process seen. Electronically Signed   By: Garald Balding M.D.   On: 10/27/2017 22:41   Ct Abdomen Pelvis W Contrast  Result Date: 10/28/2017 CLINICAL DATA:  Acute onset of right-sided abdominal pain and vomiting. Sharp right chest pain. Back pain. EXAM: CT ABDOMEN AND PELVIS WITH CONTRAST TECHNIQUE: Multidetector CT imaging of the abdomen and pelvis was performed using the standard protocol following bolus administration of intravenous contrast. CONTRAST:  155m OMNIPAQUE IOHEXOL 300 MG/ML  SOLN COMPARISON:  None. FINDINGS: Lower chest: The visualized lung bases are grossly clear. The visualized portions of the mediastinum are unremarkable. Hepatobiliary: The liver is unremarkable in appearance. Diffuse gallbladder wall thickening is nonspecific. There is dilatation of the common bile duct to 9 mm in diameter, with mild intrahepatic biliary ductal dilatation. Pancreas: The pancreas is within normal limits. Spleen: The spleen is unremarkable in appearance. Adrenals/Urinary Tract: The adrenal glands are unremarkable in appearance. The kidneys are within normal limits. There is no evidence of hydronephrosis. No renal or ureteral stones are identified. No perinephric stranding is seen. Stomach/Bowel: The stomach is unremarkable in appearance. The small bowel is within normal limits. The appendix is normal in caliber, without evidence of appendicitis. The colon is unremarkable in appearance. Vascular/Lymphatic: Minimal calcification is seen along the common iliac arteries bilaterally. The abdominal aorta is grossly unremarkable. No retroperitoneal lymphadenopathy is seen. No pelvic sidewall lymphadenopathy is identified. Reproductive: The bladder is mildly distended and grossly unremarkable. The prostate remains normal in size. Other: No additional soft tissue abnormalities are seen. Musculoskeletal: No acute osseous  abnormalities are identified. The visualized musculature is unremarkable in appearance. IMPRESSION: 1. Dilatation of the common bile duct to 9 mm in diameter, with mild intrahepatic biliary ductal dilatation.  This is of uncertain significance. Would correlate with LFTs. MRCP or ERCP could be considered for further evaluation, when and as deemed clinically appropriate. 2. Diffuse gallbladder wall thickening is nonspecific. Would correlate clinically for evidence of cholecystitis. Electronically Signed   By: Garald Balding M.D.   On: 10/28/2017 03:24   US Abdomen Limited Ruq  Result Date: 10/28/2017 CLINICAL DATA:  Acute onset of right upper quadrant abdominal pain. EXAM: ULTRASOUND ABDOMEN LIMITED RIGHT UPPER QUADRANT COMPARISON:  CT of the abdomen and pelvis performed earlier today at 2:56 a.m. FINDINGS: Gallbladder: Diffuse gallbladder wall thickening is noted, measuring up to 4 mm, with associated pericholecystic fluid. No ultrasonographic Murphy's sign is elicited. No stones are identified. Common bile duct: Diameter: 0.7 cm, mildly prominent in caliber. Liver: No focal lesion identified. Within normal limits in parenchymal echogenicity. Portal vein is patent on color Doppler imaging with normal direction of blood flow towards the liver. IMPRESSION: Diffuse gallbladder wall thickening and associated pericholecystic fluid, of uncertain significance. No stones seen. No ultrasonographic Murphy's sign elicited. The common bile duct is mildly prominent. Given normal LFTs, obstruction is considered unlikely. Electronically Signed   By: Garald Balding M.D.   On: 10/28/2017 05:21   Anti-infectives (From admission, onward)   None        Assessment/Plan HTN PSA Tobacco abuse Alcohol abuse  RUQ pain Nausea and vomiting - WBC 9.8, TMAX 100 - lipase 23, AST 27, ALT 18, alk phos 71, bilirubin 0.8 - CT scan showed dilatation of the common bile duct to 9 mm in diameter, with mild intrahepatic biliary  ductal dilatation - U/s shows diffuse gallbladder wall thickening and associated pericholecystic fluid without stones.  - HIDA scan pending - AM labs pending  ID - none VTE - SCDs FEN - IVF, NPO Foley - none Follow up - TBD  Plan - HIDA scan pending, will make further recommendations once HIDA is performed.  Patient also had a CT scan 3/30 that showed vague area of low attenuation within the head of pancreas, not seen on most recent CT. Will review with radiology, may need MRI for further workup.  Wellington Hampshire, [facility] Surgery 10/28/2017, 7:50 AM Pager: [phone] Consults: [phone] Mon-Fri 7:00 am-4:30 pm Sat-Sun 7:00 am-11:30 am

## 2017-10-28 NOTE — ED Notes (Signed)
Pt. To CT via stretcher. 

## 2017-10-28 NOTE — ED Notes (Signed)
Pt urinated into urinal for specimen to be obtained; however, mother dumped all urine down sink - advised that I needed another specimen - pt became angry stating "I can't give yall no more"; advided pt that he would be able to make more urine regardless of his NPO status - advised him that I still needed a specimen - pt currently refusing

## 2017-10-28 NOTE — ED Notes (Signed)
In to administer meds as per [facility] - pt remains in radiology

## 2017-10-28 NOTE — ED Notes (Signed)
Transported to radiology per rad tech  

## 2017-10-28 NOTE — ED Notes (Signed)
Radiology called to say pt is refusing remainder of HIDDA scan; pt being transported back to ED

## 2017-10-28 NOTE — ED Notes (Signed)
Brooke, surgery PA came to see patient. Will start regular diet, NPO after midnight and to not give any pain medication after midnight either or HIDA scan would not be able to be done. Pt updated.

## 2017-10-28 NOTE — H&P (Addendum)
History and Physical    Jonathon Obrien OZH:[id number] DOB: 11-Jul-1965 DOA: 10/27/2017  Referring MD/NP/PA:   PCP: Patient, No Pcp Per   Patient coming from:  The patient is coming from home.  At baseline, pt is independent for most of ADL.  Chief Complaint: Nausea, vomiting, right upper quadrant abdominal pain, right-sided chest pain  HPI: Jonathon Obrien is a 52 y.o. male with medical history significant of hypertension, polysubstance abuse (tobacco, cocaine), chronic back pain, aortic dilation of anterior arch, who presents with nausea, vomiting, right upper quadrant abdominal pain and right-sided chest pain.  Patient states that he has been having right upper quadrant abdominal pain for 2 days.  It is constant, 10 out of 10 severity initially, currently 6 out of 10 severity, radiating to the right lower back, sharp. It is associated with nausea and vomiting.  He vomited more than 10 times.  No diarrhea.  Patient also reports subjective fever and chills.  Patient states that he also has right-sided chest pain, which happens at the same time with his abdominal pain.  It is constant, 6 out of 10 in severity, nonradiating.  Patient does not have cough, shortness of breath.  No tenderness in the calf areas.  Denies symptoms of UTI or unilateral weakness.  He states that he used cocaine on Sunday.  ED Course: pt was found to have WBC 9.8, troponin negative, lipase is 23, AST 27, ALT 18, total bilirubin 0.2, lipase 23, ALP 71, negative urinalysis, creatinine 1.27, BUN 7, GFR>60, temperature 100, no tachycardia, oxygen saturation 100% on room air.  Chest x-ray is negative.  Patient is admitted to telemetry bed as inpatient.  General surgeon, Dr. Magnus Ivan was consulted.  # CT abdomen/pelvis showed: 1. Dilatation of the common bile duct to 9 mm in diameter, with mild intrahepatic biliary ductal dilatation. This is of uncertain significance.  2. Diffuse gallbladder wall thickening is nonspecific.  #  US-RUQ: Diffuse gallbladder wall thickening and associated pericholecystic fluid, of uncertain significance. No stones seen. No ultrasonographic Murphy's sign elicited. The common bile duct is mildly prominent. Given normal LFTs, obstruction is considered unlikely.  Review of Systems:   General: Has subjective fever and chills, no body weight gain, has poor appetite, has fatigue HEENT: no blurry vision, hearing changes or sore throat Respiratory: no dyspnea, coughing, wheezing CV:  has chest pain, no palpitations GI: Has nausea, vomiting, abdominal pain, no diarrhea, constipation GU: no dysuria, burning on urination, increased urinary frequency, hematuria  Ext: no leg edema Neuro: no unilateral weakness, numbness, or tingling, no vision change or hearing loss Skin: no rash, no skin tear. MSK: No muscle spasm, no deformity, no limitation of range of movement in spin Heme: No easy bruising.  Travel history: No recent long distant travel.  Allergy: No Known Allergies  Past Medical History:  Diagnosis Date  . Back pain   . Hypertension     Past Surgical History:  Procedure Laterality Date  . HERNIA REPAIR    . KNEE SURGERY      Social History:  reports that he has been smoking.  He has never used smokeless tobacco. He reports that he has current or past drug history. Drug: Cocaine. He reports that he does not drink alcohol.  Family History:  Family History  Problem Relation Age of Onset  . Hypertension Mother   . Hypertension Father   . Diabetes Mellitus II Father      Prior to Admission medications   Medication Sig Start Date  End Date Taking? Authorizing Provider  calcium carbonate (TUMS EX) 750 MG chewable tablet Chew 2 tablets by mouth as needed for heartburn.   Yes [provider]  diphenhydrAMINE (BENADRYL) 25 MG tablet Take 25 mg by mouth every 6 (six) hours as needed for allergies.   Yes [provider]  lisinopril (PRINIVIL,ZESTRIL) 20 MG tablet Take  20 mg by mouth daily.   Yes [provider]  Multiple Vitamin (MULTIVITAMIN WITH MINERALS) TABS tablet Take 1 tablet by mouth daily.   Yes [provider]  naproxen sodium (ALEVE) 220 MG tablet Take 440 mg by mouth daily as needed (pain).    Yes [provider]    Physical Exam: Vitals:   10/28/17 0400 10/28/17 0500 10/28/17 0559 10/28/17 0600  BP: (!) 150/96 (!) 140/91 (!) 140/91 118/86  Pulse: 76 79 71 67  Resp: 16 (!) Temp:      TempSrc:      SpO2: 99% 100% 97% 98%  Height:       General: Not in acute distress.  Dry mucous membrane HEENT:       Eyes: PERRL, EOMI, no scleral icterus.       ENT: No discharge from the ears and nose, no pharynx injection, no tonsillar enlargement.        Neck: No JVD, no bruit, no mass felt. Heme: No neck lymph node enlargement. Cardiac: S1/S2, RRR, No murmurs, No gallops or rubs. Respiratory: No rales, wheezing, rhonchi or rubs. GI: Soft, nondistended, has tenderness in RUQ, no rebound pain, no organomegaly, BS present. GU: No hematuria Ext: No pitting leg edema bilaterally. 2+DP/PT pulse bilaterally. Musculoskeletal: No joint deformities, No joint redness or warmth, no limitation of ROM in spin. Skin: No rashes.  Neuro: Alert, oriented X3, cranial nerves II-XII grossly intact, moves all extremities normally.  Psych: Patient is not psychotic, no suicidal or hemocidal ideation.  Labs on Admission: I have personally reviewed following labs and imaging studies  CBC: Recent Labs  Lab 10/27/17 2218  WBC 9.8  HGB 17.9*  HCT 57.1*  MCV 83.1  PLT 222   Basic Metabolic Panel: Recent Labs  Lab 10/27/17 2218  NA 140  K 3.8  CL 97*  CO2 28  GLUCOSE 121*  BUN 7  CREATININE 1.27*  CALCIUM 10.3   GFR: CrCl cannot be calculated (Unknown ideal weight.). Liver Function Tests: Recent Labs  Lab 10/27/17 2218  AST 27  ALT 18  ALKPHOS 71  BILITOT 0.8  PROT 8.8*  ALBUMIN 5.1*   Recent Labs  Lab  10/27/17 2218  LIPASE 23   No results for input(s): AMMONIA in the last 168 hours. Coagulation Profile: No results for input(s): INR, PROTIME in the last 168 hours. Cardiac Enzymes: No results for input(s): CKTOTAL, CKMB, CKMBINDEX, TROPONINI in the last 168 hours. BNP (last 3 results) No results for input(s): PROBNP in the last 8760 hours. HbA1C: No results for input(s): HGBA1C in the last 72 hours. CBG: No results for input(s): GLUCAP in the last 168 hours. Lipid Profile: No results for input(s): CHOL, HDL, LDLCALC, TRIG, CHOLHDL, LDLDIRECT in the last 72 hours. Thyroid Function Tests: No results for input(s): TSH, T4TOTAL, FREET4, T3FREE, THYROIDAB in the last 72 hours. Anemia Panel: No results for input(s): VITAMINB12, FOLATE, FERRITIN, TIBC, IRON, RETICCTPCT in the last 72 hours. Urine analysis:    Component Value Date/Time   COLORURINE YELLOW 10/27/2017 2225   APPEARANCEUR HAZY (A) 10/27/2017 2225   LABSPEC 1.017  10/27/2017 2225   PHURINE 8.0 10/27/2017 2225   GLUCOSEU NEGATIVE 10/27/2017 2225   HGBUR SMALL (A) 10/27/2017 2225   BILIRUBINUR NEGATIVE 10/27/2017 2225   KETONESUR 80 (A) 10/27/2017 2225   PROTEINUR 100 (A) 10/27/2017 2225   NITRITE NEGATIVE 10/27/2017 2225   LEUKOCYTESUR NEGATIVE 10/27/2017 2225   Sepsis Labs: (procalcitonin:4,lacticidven:4) )No results found for this or any previous visit (from the past 240 hour(s)).   Radiological Exams on Admission: Dg Chest 2 View  Result Date: 10/27/2017 CLINICAL DATA:  Acute onset of right-sided chest pain, generalized abdominal pain, nausea, vomiting and headache. EXAM: CHEST - 2 VIEW COMPARISON:  Chest radiograph and CTA of the chest performed 09/12/2017 FINDINGS: The lungs are well-aerated. Minimal scarring is noted at the left lung apex. There is no evidence of focal opacification, pleural effusion or pneumothorax. The heart is normal in size; the mediastinal contour is within normal limits. No acute  osseous abnormalities are seen. IMPRESSION: No acute cardiopulmonary process seen. Electronically Signed   By: Roanna Raider M.D.   On: 10/27/2017 22:41   Ct Abdomen Pelvis W Contrast  Result Date: 10/28/2017 CLINICAL DATA:  Acute onset of right-sided abdominal pain and vomiting. Sharp right chest pain. Back pain. EXAM: CT ABDOMEN AND PELVIS WITH CONTRAST TECHNIQUE: Multidetector CT imaging of the abdomen and pelvis was performed using the standard protocol following bolus administration of intravenous contrast. CONTRAST:  OMNIPAQUE IOHEXOL 300 MG/ML  SOLN COMPARISON:  None. FINDINGS: Lower chest: The visualized lung bases are grossly clear. The visualized portions of the mediastinum are unremarkable. Hepatobiliary: The liver is unremarkable in appearance. Diffuse gallbladder wall thickening is nonspecific. There is dilatation of the common bile duct to 9 mm in diameter, with mild intrahepatic biliary ductal dilatation. Pancreas: The pancreas is within normal limits. Spleen: The spleen is unremarkable in appearance. Adrenals/Urinary Tract: The adrenal glands are unremarkable in appearance. The kidneys are within normal limits. There is no evidence of hydronephrosis. No renal or ureteral stones are identified. No perinephric stranding is seen. Stomach/Bowel: The stomach is unremarkable in appearance. The small bowel is within normal limits. The appendix is normal in caliber, without evidence of appendicitis. The colon is unremarkable in appearance. Vascular/Lymphatic: Minimal calcification is seen along the common iliac arteries bilaterally. The abdominal aorta is grossly unremarkable. No retroperitoneal lymphadenopathy is seen. No pelvic sidewall lymphadenopathy is identified. Reproductive: The bladder is mildly distended and grossly unremarkable. The prostate remains normal in size. Other: No additional soft tissue abnormalities are seen. Musculoskeletal: No acute osseous abnormalities are identified.  The visualized musculature is unremarkable in appearance. IMPRESSION: 1. Dilatation of the common bile duct to 9 mm in diameter, with mild intrahepatic biliary ductal dilatation. This is of uncertain significance. Would correlate with LFTs. MRCP or ERCP could be considered for further evaluation, when and as deemed clinically appropriate. 2. Diffuse gallbladder wall thickening is nonspecific. Would correlate clinically for evidence of cholecystitis. Electronically Signed   By: Roanna Raider M.D.   On: 10/28/2017 03:24   US Abdomen Limited Ruq  Result Date: 10/28/2017 CLINICAL DATA:  Acute onset of right upper quadrant abdominal pain. EXAM: ULTRASOUND ABDOMEN LIMITED RIGHT UPPER QUADRANT COMPARISON:  CT of the abdomen and pelvis performed earlier today at 2:56 a.m. FINDINGS: Gallbladder: Diffuse gallbladder wall thickening is noted, measuring up to 4 mm, with associated pericholecystic fluid. No ultrasonographic Murphy's sign is elicited. No stones are identified. Common bile duct: Diameter: 0.7 cm, mildly prominent in caliber. Liver: No focal lesion identified. Within  normal limits in parenchymal echogenicity. Portal vein is patent on color Doppler imaging with normal direction of blood flow towards the liver. IMPRESSION: Diffuse gallbladder wall thickening and associated pericholecystic fluid, of uncertain significance. No stones seen. No ultrasonographic Murphy's sign elicited. The common bile duct is mildly prominent. Given normal LFTs, obstruction is considered unlikely. Electronically Signed   By: Roanna Raider M.D.   On: 10/28/2017 05:21     EKG: Independently reviewed.  Sinus rhythm, QTC 479, bilateral atrial enlargement, LVH  Assessment/Plan Principal Problem:   RUQ pain Active Problems:   Polysubstance abuse (HCC)    Tobacco use   Essential hypertension   Cocaine abuse (HCC)   Chest pain   RUQ pain: Etiology is not clear.  Patient has normal liver function and lipase.  CT scan and  US-RUQ showed CBD dilation, but no stone was seen. Pt has mild fever,  but no leukocytosis.  General surgeon, Dr. Magnus Ivan was consulted. He recommended to get HIDA scan for further evaluation.   -will admit to tele bed as inpt -will hold off narcotics due to need for HIDA -prn IV ketorolac -PRN Zofran for nausea -NPO -Pepcid IV -IVF: 2 L normal saline bolus, followed by 125 cc/h -will hold off Abx and f/u HIDA   Chest pain: Patient has atypical chest pain.  Seems to be due to referred pain from right upper quadrant abdominal pain.  Another possibility is cocaine abuse.  Initial troponin negative. -Aspirin -PRN nitroglycerin -Troponin x3 -Check A1c, FLP  Polysubstance abuse (Tobacco use and cocaine): -Did counseling about importance of quitting substance use -Nicotine patch -UDS  HTN:  -Continue home medications: Lisinopril -IV hydralazine prn   DVT ppx: SCD Code Status: Full code Family Communication: Yes, patient's wife at bed side Disposition Plan:  Anticipate discharge back to previous home environment Consults called: General surgeon, Dr. Magnus Ivan Admission status: Inpatient/tele      Date of Service 10/28/2017    Lorretta Harp [facility] Pager [phone]  If 7PM-7AM, please contact night-coverage [email or web address] Password [email or web address] 10/28/2017, 7:04 AM

## 2017-10-28 NOTE — ED Notes (Signed)
This RN called nuc med to let them know patient is willing to finished scan. This RN was informed that he will not be able to be re-scanned until at least 24 hours. Paged surgery to inform them Awaiting call back at this time.     Pt reports he did not refuse to finish the original scan but was told "they told me they couldn't see anything and said they were going to let the doctor know and they sent me back." However, during conversation with nuc med, this RN was informed that the technician told the patient she could not visualize his gallbladder and would possibly need to wait about 30 more minutes and would let the doctor know. Nuc med technician told this RN that this is when the patient quit the scan and did not want to finish it.

## 2017-10-28 NOTE — ED Notes (Signed)
Care assumed at this time - resting quietly on stretcher with family at bedside; interm voicing his frustration with "everything"

## 2017-10-28 NOTE — ED Notes (Signed)
Spoke with Product/process development scientist. Pt meal was ordered at 1300, but not delivered. Supervisor is going to re-make meal and will be delivered shortly. Pt given snack in the meantime.

## 2017-10-28 NOTE — ED Provider Notes (Signed)
[facility] EMERGENCY DEPARTMENT Provider Note   CSN: [id number] Arrival date & time: 10/27/17  2204     History   Chief Complaint Chief Complaint  Patient presents with  . Abdominal Pain    HPI Woodrow Drab is a 52 y.o. male.  HPI  This is a 52 year old male who presents with abdominal pain, vomiting since last night.  Patient reports that he was in his normal state of health until last night.  He went to eat and all-you-can-eat buffet.  He came home and developed abdominal and chest pain.  He reports that it is burning.  It is mostly right-sided and radiates both upwards and downwards.  He has not been able to eat anything today.  He states he has had multiple episodes of "green-colored" emesis as well as "light blood" in his emesis.  Denies any recent fevers.  Denies shortness of breath.  Pain is currently 10 out of 10.  He has not taken anything for his pain at home.  Denies urinary symptoms, weakness, numbness.  Patient did take ibuprofen with no relief.  Past Medical History:  Diagnosis Date  . Back pain   . Hypertension     Patient Active Problem List   Diagnosis Date Noted  . Hypertensive urgency 09/12/2017  . Aortic atherosclerosis (HCC) 09/12/2017  . Pulmonary nodule in R middle lobe 09/12/2017  . Aortic dilatation (HCC) of anterior arch 09/12/2017  . Polysubstance abuse (HCC)  09/12/2017  . Tobacco use 09/12/2017    Past Surgical History:  Procedure Laterality Date  . HERNIA REPAIR    . KNEE SURGERY          Home Medications    Prior to Admission medications   Medication Sig Start Date End Date Taking? Authorizing Provider  calcium carbonate (TUMS EX) 750 MG chewable tablet Chew 2 tablets by mouth as needed for heartburn.   Yes [provider]  diphenhydrAMINE (BENADRYL) 25 MG tablet Take 25 mg by mouth every 6 (six) hours as needed for allergies.   Yes [provider]  lisinopril (PRINIVIL,ZESTRIL) 20 MG tablet  Take 20 mg by mouth daily.   Yes [provider]  Multiple Vitamin (MULTIVITAMIN WITH MINERALS) TABS tablet Take 1 tablet by mouth daily.   Yes [provider]  naproxen sodium (ALEVE) 220 MG tablet Take 440 mg by mouth daily as needed (pain).    Yes [provider]    Family History No family history on file.  Social History Social History   Tobacco Use  . Smoking status: Never Smoker  . Smokeless tobacco: Never Used  Substance Use Topics  . Alcohol use: Never    Frequency: Never  . Drug use: Never     Allergies   Patient has no known allergies.   Review of Systems Review of Systems  Constitutional: Negative for fever.  Respiratory: Negative for shortness of breath.   Cardiovascular: Positive for chest pain.  Gastrointestinal: Positive for abdominal pain, nausea and vomiting. Negative for diarrhea.  Genitourinary: Negative for dysuria and hematuria.  Neurological: Negative for headaches.  All other systems reviewed and are negative.    Physical Exam Updated Vital Signs BP (!) 127/99   Pulse 82   Temp 99 F (37.2 C) (Oral)   Resp 20   Ht  (1.956 m)   SpO2 100%   BMI 23.72 kg/m   Physical Exam  Constitutional: He is oriented to person, place, and time. He appears well-developed and  well-nourished.  HENT:  Head: Normocephalic and atraumatic.  Neck: Neck supple.  Cardiovascular: Normal rate, regular rhythm and normal heart sounds.  No murmur heard. Pulmonary/Chest: Effort normal and breath sounds normal. No respiratory distress. He has no wheezes.  Abdominal: Soft. Normal appearance and bowel sounds are normal. There is tenderness in the right upper quadrant and right lower quadrant. There is no rebound and no guarding.  Musculoskeletal: He exhibits no edema.  Lymphadenopathy:    He has no cervical adenopathy.  Neurological: He is alert and oriented to person, place, and time.  Skin: Skin is warm and dry.  Psychiatric: He has  a normal mood and affect.  Nursing note and vitals reviewed.    ED Treatments / Results  Labs (all labs ordered are listed, but only abnormal results are displayed) Labs Reviewed  BASIC METABOLIC PANEL - Abnormal; Notable for the following components:      Result Value   Chloride 97 (*)    Glucose, Bld 121 (*)    Creatinine, Ser 1.27 (*)    All other components within normal limits  CBC - Abnormal; Notable for the following components:   RBC 6.87 (*)    Hemoglobin 17.9 (*)    HCT 57.1 (*)    RDW 15.6 (*)    All other components within normal limits  URINALYSIS, ROUTINE W REFLEX MICROSCOPIC - Abnormal; Notable for the following components:   APPearance HAZY (*)    Hgb urine dipstick SMALL (*)    Ketones, ur 80 (*)    Protein, ur 100 (*)    All other components within normal limits  HEPATIC FUNCTION PANEL - Abnormal; Notable for the following components:   Total Protein 8.8 (*)    Albumin 5.1 (*)    All other components within normal limits  LIPASE, BLOOD  I-STAT TROPONIN, ED    EKG EKG Interpretation  Date/Time:  Tuesday Oct 27 2017 22:10:32 EDT Ventricular Rate:  121 PR Interval:  146 QRS Duration: 80 QT Interval:  338 QTC Calculation: 479 R Axis:   72 Text Interpretation:  Sinus tachycardia Biatrial enlargement Left ventricular hypertrophy Abnormal ECG Confirmed by Ross Marcus ([id number]) on 10/28/2017 1:25:38 AM   Radiology Dg Chest 2 View  Result Date: 10/27/2017 CLINICAL DATA:  Acute onset of right-sided chest pain, generalized abdominal pain, nausea, vomiting and headache. EXAM: CHEST - 2 VIEW COMPARISON:  Chest radiograph and CTA of the chest performed 09/12/2017 FINDINGS: The lungs are well-aerated. Minimal scarring is noted at the left lung apex. There is no evidence of focal opacification, pleural effusion or pneumothorax. The heart is normal in size; the mediastinal contour is within normal limits. No acute osseous abnormalities are seen. IMPRESSION: No  acute cardiopulmonary process seen. Electronically Signed   By: Roanna Raider M.D.   On: 10/27/2017 22:41   Ct Abdomen Pelvis W Contrast  Result Date: 10/28/2017 CLINICAL DATA:  Acute onset of right-sided abdominal pain and vomiting. Sharp right chest pain. Back pain. EXAM: CT ABDOMEN AND PELVIS WITH CONTRAST TECHNIQUE: Multidetector CT imaging of the abdomen and pelvis was performed using the standard protocol following bolus administration of intravenous contrast. CONTRAST:  OMNIPAQUE IOHEXOL 300 MG/ML  SOLN COMPARISON:  None. FINDINGS: Lower chest: The visualized lung bases are grossly clear. The visualized portions of the mediastinum are unremarkable. Hepatobiliary: The liver is unremarkable in appearance. Diffuse gallbladder wall thickening is nonspecific. There is dilatation of the common bile duct to 9 mm in diameter, with mild intrahepatic biliary  ductal dilatation. Pancreas: The pancreas is within normal limits. Spleen: The spleen is unremarkable in appearance. Adrenals/Urinary Tract: The adrenal glands are unremarkable in appearance. The kidneys are within normal limits. There is no evidence of hydronephrosis. No renal or ureteral stones are identified. No perinephric stranding is seen. Stomach/Bowel: The stomach is unremarkable in appearance. The small bowel is within normal limits. The appendix is normal in caliber, without evidence of appendicitis. The colon is unremarkable in appearance. Vascular/Lymphatic: Minimal calcification is seen along the common iliac arteries bilaterally. The abdominal aorta is grossly unremarkable. No retroperitoneal lymphadenopathy is seen. No pelvic sidewall lymphadenopathy is identified. Reproductive: The bladder is mildly distended and grossly unremarkable. The prostate remains normal in size. Other: No additional soft tissue abnormalities are seen. Musculoskeletal: No acute osseous abnormalities are identified. The visualized musculature is unremarkable in  appearance. IMPRESSION: 1. Dilatation of the common bile duct to 9 mm in diameter, with mild intrahepatic biliary ductal dilatation. This is of uncertain significance. Would correlate with LFTs. MRCP or ERCP could be considered for further evaluation, when and as deemed clinically appropriate. 2. Diffuse gallbladder wall thickening is nonspecific. Would correlate clinically for evidence of cholecystitis. Electronically Signed   By: Roanna Raider M.D.   On: 10/28/2017 03:24   US Abdomen Limited Ruq  Result Date: 10/28/2017 CLINICAL DATA:  Acute onset of right upper quadrant abdominal pain. EXAM: ULTRASOUND ABDOMEN LIMITED RIGHT UPPER QUADRANT COMPARISON:  CT of the abdomen and pelvis performed earlier today at 2:56 a.m. FINDINGS: Gallbladder: Diffuse gallbladder wall thickening is noted, measuring up to 4 mm, with associated pericholecystic fluid. No ultrasonographic Murphy's sign is elicited. No stones are identified. Common bile duct: Diameter: 0.7 cm, mildly prominent in caliber. Liver: No focal lesion identified. Within normal limits in parenchymal echogenicity. Portal vein is patent on color Doppler imaging with normal direction of blood flow towards the liver. IMPRESSION: Diffuse gallbladder wall thickening and associated pericholecystic fluid, of uncertain significance. No stones seen. No ultrasonographic Murphy's sign elicited. The common bile duct is mildly prominent. Given normal LFTs, obstruction is considered unlikely. Electronically Signed   By: Roanna Raider M.D.   On: 10/28/2017 05:21    Procedures Procedures (including critical care time)  Medications Ordered in ED Medications  sodium chloride 0.9 % bolus 1,000 mL (has no administration in time range)  sodium chloride 0.9 % bolus 1,000 mL (0 mLs Intravenous Stopped 10/28/17 0233)  ondansetron (ZOFRAN) injection 4 mg (4 mg Intravenous Given 10/28/17 0136)  morphine 4 MG/ML injection 4 mg (4 mg Intravenous Given 10/28/17 0201)  iohexol  (OMNIPAQUE) 300 MG/ML solution 100 mL (100 mLs Intravenous Contrast Given 10/28/17 0245)  HYDROmorphone (DILAUDID) injection 1 mg (1 mg Intravenous Given 10/28/17 0359)     Initial Impression / Assessment and Plan / ED Course  I have reviewed the triage vital signs and the nursing notes.  Pertinent labs & imaging results that were available during my care of the patient were reviewed by me and considered in my medical decision making (see chart for details).     Patient presents with abdominal pain, chest pain, and vomiting.  Onset after eating a large meal on Monday night.  He is overall nontoxic-appearing on exam and vital signs reassuring.  He does have right-sided tenderness to palpation.  No rebound or guarding.  Patient was given pain and nausea medication.  Initial lab work reviewed from triage.  Patient appears dry with 80 ketones in his urine and a creatinine of 1.27.  No leukocytosis.  Considerations include appendicitis, cholecystitis, atypical kidney stone, gastroenteritis.  CT scan obtained.  CT scan shows common bile duct thickening without stones.  LFTs are normal.  Ultrasound obtained to better clarify.  Ultrasound shows gallbladder wall thickening and pericholecystic fluid but no evidence of stones or Murphy sign.  This is of unclear significance.  Patient continues to have pain on multiple rechecks.  He was given another liter of fluid and repeat pain medications.  5:49 AM Discussed with Dr. Magnus Ivan.  He recommends a HIDA scan.  Will be evaluated by general surgery.  Given ongoing pain, will admit to the hospitalist for pain control while work-up is pending.  Final Clinical Impressions(s) / ED Diagnoses   Final diagnoses:  RUQ pain  Dehydration    ED Discharge Orders    None       Shon Baton, MD 10/28/17 [phone]

## 2017-10-28 NOTE — ED Notes (Addendum)
Pt and wife updated on room assignment.

## 2017-10-28 NOTE — ED Notes (Signed)
Called into room by pt's mother - pt reports was attempting to get urine specimen and inadvertently pulled his IV out; bleeding controlled at this time; floor cleansed where blood had dripped all over room; pt interm expressing his disconcern

## 2017-10-28 NOTE — Progress Notes (Signed)
PROGRESS NOTE  Subjective: Jonathon Obrien is a 52 y.o. male with a history of cocaine and tobacco use, HTN, chronic back pain and anterior aortic arch dilatation who presented with RUQ and right chest pain associated with fever, nausea and vomiting. Medications improved symptoms in ED. CT abd/pelvis showed CBD dilatation to 9mm with mild intrahepatic ductal dilatation without evident stone. Gallbladder wall thickening was noted and confirmed on RUQ U/S with pericholecystic fluid, again no stones. No murphy's sign on exam. LFTs and lipase wnl, no leukocytosis. Surgery consulted, planning HIDA scan for further evaluation.   Objective: BP (!) 135/107   Pulse (!) 54   Temp 99 F (37.2 C) (Oral)   Resp 14   Ht  (1.956 m)   SpO2 (!) 67%   BMI 23.72 kg/m   Gen: Thin 51yo male in no distress. Frustrated.  Pulm: Clear and nonlabored on room air  CV: RRR, no murmur, no JVD, no edema GI: Soft, Reports tenderness to palpation in right side (upper and lower quadrants) though does not grimace, negative Murphy's sign. Nondistended, +BS  Neuro: Alert and oriented. No focal deficits. Skin: No rashes, lesions or ulcers  Assessment & Plan: Await HIDA result for further recommendations by general surgery.   [name], MD [facility] Pager [phone] 10/28/2017, 12:38 PM

## 2017-10-28 NOTE — ED Notes (Signed)
CBG 87 mg/dl

## 2017-10-28 NOTE — ED Notes (Signed)
Report given to 5C RN 

## 2017-10-28 NOTE — ED Notes (Signed)
Warm blankets given per pt request. 

## 2017-10-28 NOTE — ED Notes (Signed)
Pt demanding food or threatening to leave AMA. Pt is NPO. This RN spoke with surgery MD and told to have patient retake the HIDA scan (pt did not finish original scan). After a long conversation with patient, this RN was able to de-escalate patient and convince him to retake the HIDA scan.

## 2017-10-29 ENCOUNTER — Inpatient Hospital Stay (HOSPITAL_COMMUNITY): Payer: Medicaid Other

## 2017-10-29 LAB — LIPID PANEL
CHOL/HDL RATIO: 2 ratio
Cholesterol: 147 mg/dL (ref 0–200)
HDL: 74 mg/dL (ref >40)
LDL CALC: 52 mg/dL (ref 0–99)
Triglycerides: 103 mg/dL (ref <150)
VLDL: 21 mg/dL (ref 0–40)

## 2017-10-29 LAB — COMPREHENSIVE METABOLIC PANEL
ALT: 14 U/L — ABNORMAL LOW (ref 17–63)
ANION GAP: 9 (ref 5–15)
AST: 17 U/L (ref 15–41)
Albumin: 3.4 g/dL — ABNORMAL LOW (ref 3.5–5.0)
Alkaline Phosphatase: 48 U/L (ref 38–126)
BUN: 9 mg/dL (ref 6–20)
CALCIUM: 8.9 mg/dL (ref 8.9–10.3)
CHLORIDE: 102 mmol/L (ref 101–111)
CO2: 31 mmol/L (ref 22–32)
Creatinine, Ser: 1.26 mg/dL — ABNORMAL HIGH (ref 0.61–1.24)
GFR calc non Af Amer: >60 mL/min (ref >60)
Glucose, Bld: 70 mg/dL (ref 65–99)
Potassium: 4.5 mmol/L (ref 3.5–5.1)
SODIUM: 142 mmol/L (ref 135–145)
TOTAL PROTEIN: 5.8 g/dL — AB (ref 6.5–8.1)
Total Bilirubin: 0.3 mg/dL (ref 0.3–1.2)

## 2017-10-29 LAB — CBC
HEMATOCRIT: 44.2 % (ref 39.0–52.0)
Hemoglobin: 13.7 g/dL (ref 13.0–17.0)
MCH: 26.3 pg (ref 26.0–34.0)
MCHC: 31 g/dL (ref 30.0–36.0)
MCV: 85 fL (ref 78.0–100.0)
PLATELETS: 237 10*3/uL (ref 150–400)
RBC: 5.2 MIL/uL (ref 4.22–5.81)
RDW: 14.7 % (ref 11.5–15.5)
WBC: 5.4 10*3/uL (ref 4.0–10.5)

## 2017-10-29 LAB — HEMOGLOBIN A1C
Hgb A1c MFr Bld: 5.1 % (ref 4.8–5.6)
Mean Plasma Glucose: 99.67 mg/dL

## 2017-10-29 LAB — GLUCOSE, CAPILLARY: GLUCOSE-CAPILLARY: 88 mg/dL (ref 65–99)

## 2017-10-29 MED ORDER — PIPERACILLIN-TAZOBACTAM 3.375 G IVPB
3.3750 g | Freq: Three times a day (TID) | INTRAVENOUS | Status: DC
  Administered 2017-10-29 – 2017-11-02 (×11): 3.375 g via INTRAVENOUS
  Filled 2017-10-29 (×12): qty 50

## 2017-10-29 MED ORDER — MORPHINE SULFATE (PF) 4 MG/ML IV SOLN
2.9000 mg | Freq: Once | INTRAVENOUS | Status: AC
Start: 2017-10-29 — End: 2017-10-29
  Administered 2017-10-29: 2.9 mg via INTRAVENOUS

## 2017-10-29 MED ORDER — CHLORHEXIDINE GLUCONATE CLOTH 2 % EX PADS
6.0000 | MEDICATED_PAD | Freq: Once | CUTANEOUS | Status: AC
  Administered 2017-10-29: 6 via TOPICAL

## 2017-10-29 MED ORDER — KETOROLAC TROMETHAMINE 30 MG/ML IJ SOLN: 30.0000 mg | Freq: Once | INTRAMUSCULAR | Status: DC

## 2017-10-29 MED ORDER — HEPARIN SODIUM (PORCINE) 5000 UNIT/ML IJ SOLN
5000.0000 [IU] | Freq: Three times a day (TID) | INTRAMUSCULAR | Status: DC
  Administered 2017-10-29 – 2017-11-02 (×9): 5000 [IU] via SUBCUTANEOUS
  Filled 2017-10-29 (×9): qty 1

## 2017-10-29 MED ORDER — CHLORHEXIDINE GLUCONATE CLOTH 2 % EX PADS
6.0000 | MEDICATED_PAD | Freq: Once | CUTANEOUS | Status: AC
  Administered 2017-10-30: 6 via TOPICAL

## 2017-10-29 MED ORDER — TECHNETIUM TC 99M MEBROFENIN IV KIT
5.2500 | PACK | Freq: Once | INTRAVENOUS | Status: AC | PRN
  Administered 2017-10-29: 5.25 via INTRAVENOUS

## 2017-10-29 MED ORDER — MORPHINE SULFATE (PF) 2 MG/ML IV SOLN
2.0000 mg | INTRAVENOUS | Status: DC | PRN
  Administered 2017-10-29 – 2017-10-30 (×3): 2 mg via INTRAVENOUS
  Administered 2017-10-30 (×2): 4 mg via INTRAVENOUS
  Administered 2017-10-30 – 2017-10-31 (×3): 2 mg via INTRAVENOUS
  Administered 2017-10-31: 4 mg via INTRAVENOUS
  Administered 2017-10-31: 2 mg via INTRAVENOUS
  Filled 2017-10-29: qty 2
  Filled 2017-10-29 (×2): qty 1
  Filled 2017-10-29: qty 2
  Filled 2017-10-29 (×5): qty 1
  Filled 2017-10-29: qty 2

## 2017-10-29 MED ORDER — MORPHINE SULFATE (PF) 4 MG/ML IV SOLN
INTRAVENOUS | Status: AC
Start: 2017-10-29 — End: 2017-10-29
  Administered 2017-10-29: 2.9 mg via INTRAVENOUS
  Filled 2017-10-29: qty 1

## 2017-10-29 NOTE — Progress Notes (Signed)
Patient finished hida scan at around 1400 today. Doctor changed diet order from NPO to regular diet. Patient called cafeteria to order lunch and dinner tray. Trays did not come until 1830. Patient complaining that the trays did not come in when patient wanted them to. Patient became verbally aggressive towards staff, including charge nurse.

## 2017-10-29 NOTE — Progress Notes (Signed)
[facility] PROGRESS NOTE  Brief Narrative: Jonathon Obrien is a 52 y.o. male with a history of cocaine and tobacco use, HTN, chronic back pain and anterior aortic arch dilatation who presented with RUQ and right chest pain associated with fever, nausea and vomiting. Medications improved symptoms in ED. CT abd/pelvis showed CBD dilatation to 9mm with mild intrahepatic ductal dilatation without evident stone. Gallbladder wall thickening was noted and confirmed on RUQ U/S with pericholecystic fluid, again no stones. No murphy's sign on exam. LFTs and lipase wnl, no leukocytosis. Surgery consulted and HIDA scan was ordered.   Subjective: HIDA scan was not completed, did not show GB filling. Ate last night with mild nausea and pain, postprandial loose stools. No fevers.  Objective: BP 117/69   Pulse 64   Temp 98 F (36.7 C) (Oral)   Resp 16   Ht  (1.956 m)   Wt 73 kg (161 lb)   SpO2 100%   BMI 19.09 kg/m   Gen: Thin 51yo male laying in bed in no distress Pulm: Clear, nonlabored CV: RRR, no m/r/g, edema, or JVD. GI: Right sided tenderness without rebound or guarding. Lower abdominal laparoscopic scars noted. +BS.   Neuro: Alert and oriented. No focal deficits. Skin: No rashes, lesions or ulcers  Assessment & Plan: RUQ pain, suspected choelscystitis: With referred pain to chest, associated with N/V/D. - Discussed with patient need to complete HIDA today. Further evaluation/management per general surgery.  - Pain medications as ordered - Antiemetics - No abx with stable vital signs.  Pancreatic lesion: Noted on CTA C/A/P 3/30: There is a focal area of relative hypoenhancement at the level of the head of pancreas measuring 2.1 cm. - Will need MRI for further evaluation, awaiting further work up with HIDA at this time.   Atypical chest pain: suspect referred from intraabdominal pathology. Troponins negative, no ischemic changes on ECG.  - No further evaluation planned.  HTN:  LVH noted on ECG. - Continue lisinopril  Tobacco use:  - Cessation counseling provided - Nicotine patch ordered  Cocaine use: +UDS. - Cessation counseling provided.   [name], MD [facility] Pager [phone] 10/29/2017, 10:36 AM

## 2017-10-29 NOTE — Progress Notes (Signed)
Patient ID: Jonathon Obrien, male   DOB: 01/21/1966, 52 y.o.   MRN: [id number]     Subjective: Still with right upper quadrant abdominal pain but somewhat better than admission.  Denies nausea or other complaints.  Objective: Vital signs in last 24 hours: Temp:  [98 F (36.7 C)-98.3 F (36.8 C)] 98 F (36.7 C) (05/16 0725) Pulse Rate:  [54-171] 64 (05/16 0725) Resp:  [16-23] 16 (05/16 0725) BP: (110-135)/(69-107) 117/69 (05/16 0725) SpO2:  [67 %-100 %] 100 % (05/16 0725) Weight:  [73 kg (161 lb)] 73 kg (161 lb) (05/15 1810) Last BM Date: 10/28/17  Intake/Output from previous day: 05/15 0701 - 05/16 0700 In: 2896.3 [P.O.:640; I.V.:2156.3; IV Piggyback:100] Out: 475 [Urine:475] Intake/Output this shift: No intake/output data recorded.  General appearance: alert, cooperative, no distress and thin AA male GI: abnormal findings:  mild tenderness in the RUQ  Lab Results:  Recent Labs    10/27/17 2218 10/29/17 0250  WBC 9.8 5.4  HGB 17.9* 13.7  HCT 57.1* 44.2  PLT 222 237   BMET Recent Labs    10/27/17 2218 10/29/17 0250  NA 140 142  K 3.8 4.5  CL 97* 102  CO2 28 31  GLUCOSE 121* 70  BUN 7 9  CREATININE 1.27* 1.26*  CALCIUM 10.3 8.9     Studies/Results: Dg Chest 2 View  Result Date: 10/27/2017 CLINICAL DATA:  Acute onset of right-sided chest pain, generalized abdominal pain, nausea, vomiting and headache. EXAM: CHEST - 2 VIEW COMPARISON:  Chest radiograph and CTA of the chest performed 09/12/2017 FINDINGS: The lungs are well-aerated. Minimal scarring is noted at the left lung apex. There is no evidence of focal opacification, pleural effusion or pneumothorax. The heart is normal in size; the mediastinal contour is within normal limits. No acute osseous abnormalities are seen. IMPRESSION: No acute cardiopulmonary process seen. Electronically Signed   By: Roanna Raider M.D.   On: 10/27/2017 22:41   Ct Abdomen Pelvis W Contrast  Result Date: 10/28/2017 CLINICAL DATA:   Acute onset of right-sided abdominal pain and vomiting. Sharp right chest pain. Back pain. EXAM: CT ABDOMEN AND PELVIS WITH CONTRAST TECHNIQUE: Multidetector CT imaging of the abdomen and pelvis was performed using the standard protocol following bolus administration of intravenous contrast. CONTRAST:  OMNIPAQUE IOHEXOL 300 MG/ML  SOLN COMPARISON:  None. FINDINGS: Lower chest: The visualized lung bases are grossly clear. The visualized portions of the mediastinum are unremarkable. Hepatobiliary: The liver is unremarkable in appearance. Diffuse gallbladder wall thickening is nonspecific. There is dilatation of the common bile duct to 9 mm in diameter, with mild intrahepatic biliary ductal dilatation. Pancreas: The pancreas is within normal limits. Spleen: The spleen is unremarkable in appearance. Adrenals/Urinary Tract: The adrenal glands are unremarkable in appearance. The kidneys are within normal limits. There is no evidence of hydronephrosis. No renal or ureteral stones are identified. No perinephric stranding is seen. Stomach/Bowel: The stomach is unremarkable in appearance. The small bowel is within normal limits. The appendix is normal in caliber, without evidence of appendicitis. The colon is unremarkable in appearance. Vascular/Lymphatic: Minimal calcification is seen along the common iliac arteries bilaterally. The abdominal aorta is grossly unremarkable. No retroperitoneal lymphadenopathy is seen. No pelvic sidewall lymphadenopathy is identified. Reproductive: The bladder is mildly distended and grossly unremarkable. The prostate remains normal in size. Other: No additional soft tissue abnormalities are seen. Musculoskeletal: No acute osseous abnormalities are identified. The visualized musculature is unremarkable in appearance. IMPRESSION: 1. Dilatation of the common bile  duct to 9 mm in diameter, with mild intrahepatic biliary ductal dilatation. This is of uncertain significance. Would correlate  with LFTs. MRCP or ERCP could be considered for further evaluation, when and as deemed clinically appropriate. 2. Diffuse gallbladder wall thickening is nonspecific. Would correlate clinically for evidence of cholecystitis. Electronically Signed   By: Roanna Raider M.D.   On: 10/28/2017 03:24   Nm Hepato W/eject Fract  Result Date: 10/28/2017 CLINICAL DATA:  Right upper quadrant pain, nausea, and vomiting. EXAM: NUCLEAR MEDICINE HEPATOBILIARY IMAGING TECHNIQUE: Sequential images of the abdomen were obtained out to 60 minutes following intravenous administration of radiopharmaceutical. RADIOPHARMACEUTICALS:  5 mCi Tc-22m  Choletec IV COMPARISON:  Right upper quadrant ultrasound and CT abdomen and pelvis 10/28/2017 FINDINGS: Prompt uptake and biliary excretion of activity by the liver is seen. Biliary activity passes into small bowel, consistent with patent common bile duct. No gallbladder activity was visualized during the first 60 minutes of imaging. At that point, the patient terminated the examination. IMPRESSION: 1. Nonvisualization of the gallbladder after 1 hour of imaging, indeterminate as the patient terminated the examination at this point resulting in an incomplete examination. Acute and chronic cholecystitis remain in the differential. 2. Patent common bile duct. Electronically Signed   By: Sebastian Ache M.D.   On: 10/28/2017 12:48   US Abdomen Limited Ruq  Result Date: 10/28/2017 CLINICAL DATA:  Acute onset of right upper quadrant abdominal pain. EXAM: ULTRASOUND ABDOMEN LIMITED RIGHT UPPER QUADRANT COMPARISON:  CT of the abdomen and pelvis performed earlier today at 2:56 a.m. FINDINGS: Gallbladder: Diffuse gallbladder wall thickening is noted, measuring up to 4 mm, with associated pericholecystic fluid. No ultrasonographic Murphy's sign is elicited. No stones are identified. Common bile duct: Diameter: 0.7 cm, mildly prominent in caliber. Liver: No focal lesion identified. Within normal limits in  parenchymal echogenicity. Portal vein is patent on color Doppler imaging with normal direction of blood flow towards the liver. IMPRESSION: Diffuse gallbladder wall thickening and associated pericholecystic fluid, of uncertain significance. No stones seen. No ultrasonographic Murphy's sign elicited. The common bile duct is mildly prominent. Given normal LFTs, obstruction is considered unlikely. Electronically Signed   By: Roanna Raider M.D.   On: 10/28/2017 05:21    Anti-infectives: Anti-infectives (From admission, onward)   None      Assessment/Plan: Patient presents with acute right upper quadrant abdominal pain.  CT and ultrasound suggest cholecystitis, no stones.  Also chronically mildly dilated bile ducts and questionable area of hypodensity in the pancreas not completely worked up.  Normal LFTs.  He is stable or slightly improved since admission.  Could not complete HIDA yesterday.  This is ordered for today and he states he will complete the exam.  May require cholecystectomy.  At some point will need MRI to more fully evaluate pancreas and distal bile duct.  Stable at present.    LOS: 1 day    Mariella Saa 10/29/2017

## 2017-10-29 NOTE — Progress Notes (Signed)
Pharmacy Antibiotic Note  Jonathon Obrien is a 52 y.o. male admitted on 10/27/2017 with abdominal pain, N/V.  Pt is afebrile, WBC wnl.  Plan for HIDA scan.  Pharmacy has been consulted for Zosyn dosing.  Plan: Zosyn 3.375g IV q8h (4 hour infusion).  Height:  (195.6 cm) Weight: 161 lb (73 kg) IBW/kg (Calculated) : 89.1  Temp (24hrs), Avg:98.1 F (36.7 C), Min:98 F (36.7 C), Max:98.3 F (36.8 C)  Recent Labs  Lab 10/27/17 2218 10/29/17 0250  WBC 9.8 5.4  CREATININE 1.27* 1.26*    Estimated Creatinine Clearance: 72.4 mL/min (A) (by C-G formula based on SCr of 1.26 mg/dL (H)).    No Known Allergies  Antimicrobials this admission: Zosyn 5/16 >>   Dose adjustments this admission: none  Microbiology results: None as of 5/16  Thank you for allowing pharmacy to be a part of this patient's care.  [employer], Pharm.D., BCPS Clinical Pharmacist Pager: [phone] 10/29/2017 4:24 PM

## 2017-10-30 ENCOUNTER — Inpatient Hospital Stay (HOSPITAL_COMMUNITY): Payer: Medicaid Other

## 2017-10-30 ENCOUNTER — Encounter (HOSPITAL_COMMUNITY): Payer: Self-pay | Admitting: Anesthesiology

## 2017-10-30 LAB — SURGICAL PCR SCREEN
MRSA, PCR: NEGATIVE
Staphylococcus aureus: NEGATIVE

## 2017-10-30 LAB — COMPREHENSIVE METABOLIC PANEL
ALBUMIN: 2.9 g/dL — AB (ref 3.5–5.0)
ALK PHOS: 38 U/L (ref 38–126)
ALT: 13 U/L — AB (ref 17–63)
AST: 14 U/L — ABNORMAL LOW (ref 15–41)
Anion gap: 6 (ref 5–15)
BUN: 9 mg/dL (ref 6–20)
CALCIUM: 8.7 mg/dL — AB (ref 8.9–10.3)
CO2: 30 mmol/L (ref 22–32)
CREATININE: 1.01 mg/dL (ref 0.61–1.24)
Chloride: 108 mmol/L (ref 101–111)
GFR calc Af Amer: >60 mL/min (ref >60)
GFR calc non Af Amer: >60 mL/min (ref >60)
GLUCOSE: 106 mg/dL — AB (ref 65–99)
Potassium: 4.4 mmol/L (ref 3.5–5.1)
SODIUM: 144 mmol/L (ref 135–145)
Total Bilirubin: 0.4 mg/dL (ref 0.3–1.2)
Total Protein: 5 g/dL — ABNORMAL LOW (ref 6.5–8.1)

## 2017-10-30 LAB — GLUCOSE, CAPILLARY: Glucose-Capillary: 85 mg/dL (ref 65–99)

## 2017-10-30 MED ORDER — GADOBENATE DIMEGLUMINE 529 MG/ML IV SOLN
20.0000 mL | Freq: Once | INTRAVENOUS | Status: AC
  Administered 2017-10-30: 19 mL via INTRAVENOUS

## 2017-10-30 NOTE — Progress Notes (Signed)
MRCP was negative for pancreatic mass.  HIDA + for cholecystitis.  Unable to proceed with OR today.  May have regular diet and NPO p MN.  Plan for OR tomorrow.  This was told to the RN who was asked to let the patient know.  Jonathon Obrien 3:14 PM 10/30/2017

## 2017-10-30 NOTE — Plan of Care (Signed)
Neuro: Pt A&O X4, neuro exam WNL. Pt agitated and aggressive at time, but easily redirectable. Will continue to monitor.   Respiratory: Pt remains on RA, lungs clear/diminished.   Cardiovascular: Pt remains in sinus rhythm with no ectopy at this time. BP continues to be WDL. Pt afebrile with no edema noted.  GI/GU :Pt voiding in urinal with no difficulty. Pt c/o abd pain and distention but with good PO appetite at this time. Pt tolerated several meals with no discomfort.   Skin: Skin intact with no s/s of skin breakdown at this time. Pt able to reposition independently.   Pain: Pt c/o pain in abd and reporting adequate relief with PRN medications.   Events: NO acute events throughout shift. Pts plan of care to continue with current regimen, plan for surgery tomorrow. Family updated and no further questions at this time.

## 2017-10-30 NOTE — Care Management Note (Signed)
Case Management Note  Patient Details  Name: Maurio Baize MRN: [id number] Date of Birth: 1965-07-18  Subjective/Objective:  From home with wife,  presents with acute right upper quadrant abdominal pain.  CT and ultrasound suggest cholecystitis, no stones.     for Lap chole today.  NCM asked patient who is his PCP, he states he and his wife is working on this with his Retail buyer at [employer].  NCM stated will call DSS to find out , patient stated he did not want NCM to call DSS, he and wife on working on this, he does not want anyone else doing anything. NCM informed him that we need a PCP for him to follow up with.                       Action/Plan: For Lap Chole today 5/17.  DC home with wife at discharge.   Expected Discharge Date:  10/30/17               Expected Discharge Plan:  Home/Self Care  In-House Referral:     Discharge planning Services  CM Consult  Post Acute Care Choice:    Choice offered to:     DME Arranged:    DME Agency:     HH Arranged:    HH Agency:     Status of Service:  In process, will continue to follow  If discussed at Long Length of Stay Meetings, dates discussed:    Additional Comments:  Leone Haven, RN 10/30/2017, 11:29 AM

## 2017-10-30 NOTE — Progress Notes (Signed)
[facility] PROGRESS NOTE  Brief Narrative: Jonathon Obrien is a 52 y.o. male with a history of cocaine and tobacco use, HTN, chronic back pain and anterior aortic arch dilatation who presented with RUQ and right chest pain associated with fever, nausea and vomiting. Medications improved symptoms in ED. CT abd/pelvis showed CBD dilatation to 9mm with mild intrahepatic ductal dilatation without evident stone. Gallbladder wall thickening was noted and confirmed on RUQ U/S with pericholecystic fluid, again no stones. No murphy's sign on exam. LFTs and lipase wnl, no leukocytosis. Surgery consulted and HIDA scan was ordered. No uptake into gallbladder was noted so cholecystectomy is scheduled. MRI to follow up previously seen pancreatic lesion showed no such lesions and demonstrated cystic duct stone.  Subjective: Frustrated and cursing about delay in getting food trays sent up yesterday evening. Was told MRI would be sooner than it was, then told surgery would be sooner than it will be. Now disappointed to learn of plan to advance diet as tolerated post op.  Objective: BP (!) 145/83 (BP Location: Left Arm)   Pulse 70   Temp 98.4 F (36.9 C) (Oral)   Resp 16   Ht  (1.956 m)   Wt 73 kg (161 lb)   SpO2 100%   BMI 19.09 kg/m   Gen: Thin 51yo male sitting up on bed in no distress. Pulm: Clear, nonlabored on room air CV: RRR, no m/r/g, edema, or JVD. No heaves. GI: Right upper > lower abd tenderness without distention, rebound or guarding. +BS. Neuro: Alert and oriented. No focal deficits. Gait slow due to pain but narrow-based. Skin: No rashes, lesions or ulcers. Nicotine patch in place on right lateral shoulder. Lower abdominal laparoscopic scars noted.  Assessment & Plan: Acute cholecystitis and obstructive cystic duct cholelithiasis: With referred pain to chest, associated with N/V/D. - For lap chole today per surgery, NPO. - Pain medications as ordered - Antiemetics -  Zosyn  Pancreatic lesion: Noted on CTA C/A/P 3/30: There is a focal area of relative hypoenhancement at the level of the head of pancreas measuring 2.1 cm. Follow up MRI 5/17 showed no pancreatic lesions.  Atypical chest pain: suspect referred from intraabdominal pathology. Troponins negative, no ischemic changes on ECG.  - No further evaluation planned.  HTN: LVH noted on ECG. - Continue lisinopril, may need augmented regimen pending BPs postop (possibly up from pain)  Tobacco use:  - Cessation counseling provided - Nicotine patch ordered  Cocaine use: +UDS. - Cessation counseling provided.   Jonathon Nine, MD [facility] Pager [phone] 10/30/2017, 12:38 PM

## 2017-10-31 ENCOUNTER — Encounter (HOSPITAL_COMMUNITY)
Admission: EM | Disposition: A | Discharge status: Home / Self Care | Payer: Self-pay | Source: Home / Self Care | Attending: Family Medicine

## 2017-10-31 ENCOUNTER — Inpatient Hospital Stay (HOSPITAL_COMMUNITY): Payer: Medicaid Other | Admitting: Certified Registered"

## 2017-10-31 ENCOUNTER — Encounter (HOSPITAL_COMMUNITY): Payer: Self-pay | Admitting: Certified Registered"

## 2017-10-31 DIAGNOSIS — K810 Acute cholecystitis: Secondary | ICD-10-CM

## 2017-10-31 DIAGNOSIS — K8020 Calculus of gallbladder without cholecystitis without obstruction: Secondary | ICD-10-CM | POA: Diagnosis present

## 2017-10-31 HISTORY — PX: CHOLECYSTECTOMY: SHX55

## 2017-10-31 LAB — GLUCOSE, CAPILLARY: GLUCOSE-CAPILLARY: 105 mg/dL — AB (ref 65–99)

## 2017-10-31 SURGERY — LAPAROSCOPIC CHOLECYSTECTOMY
Anesthesia: General

## 2017-10-31 SURGERY — LAPAROSCOPIC CHOLECYSTECTOMY
Anesthesia: General | Site: Abdomen

## 2017-10-31 MED ORDER — ONDANSETRON HCL 4 MG/2ML IJ SOLN
INTRAMUSCULAR | Status: DC | PRN
  Administered 2017-10-31: 4 mg via INTRAVENOUS

## 2017-10-31 MED ORDER — DEXAMETHASONE SODIUM PHOSPHATE 10 MG/ML IJ SOLN
INTRAMUSCULAR | Status: AC
  Filled 2017-10-31: qty 1

## 2017-10-31 MED ORDER — MIDAZOLAM HCL 2 MG/2ML IJ SOLN
INTRAMUSCULAR | Status: DC | PRN
  Administered 2017-10-31: 2 mg via INTRAVENOUS

## 2017-10-31 MED ORDER — FENTANYL CITRATE (PF) 100 MCG/2ML IJ SOLN
25.0000 ug | INTRAMUSCULAR | Status: DC | PRN
  Administered 2017-10-31 (×2): 50 ug via INTRAVENOUS

## 2017-10-31 MED ORDER — FENTANYL CITRATE (PF) 250 MCG/5ML IJ SOLN
INTRAMUSCULAR | Status: AC
  Filled 2017-10-31: qty 5

## 2017-10-31 MED ORDER — BUPIVACAINE-EPINEPHRINE (PF) 0.25% -1:200000 IJ SOLN
INTRAMUSCULAR | Status: AC
  Filled 2017-10-31: qty 30

## 2017-10-31 MED ORDER — OXYCODONE HCL 5 MG/5ML PO SOLN: 5.0000 mg | Freq: Once | ORAL | Status: DC | PRN

## 2017-10-31 MED ORDER — LIDOCAINE 2% (20 MG/ML) 5 ML SYRINGE
INTRAMUSCULAR | Status: DC | PRN
  Administered 2017-10-31: 60 mg via INTRAVENOUS

## 2017-10-31 MED ORDER — 0.9 % SODIUM CHLORIDE (POUR BTL) OPTIME
TOPICAL | Status: DC | PRN
  Administered 2017-10-31: 1000 mL

## 2017-10-31 MED ORDER — LACTATED RINGERS IV SOLN
INTRAVENOUS | Status: DC
  Administered 2017-10-31: 10:00:00 via INTRAVENOUS

## 2017-10-31 MED ORDER — PHENYLEPHRINE 40 MCG/ML (10ML) SYRINGE FOR IV PUSH (FOR BLOOD PRESSURE SUPPORT)
PREFILLED_SYRINGE | INTRAVENOUS | Status: DC | PRN
  Administered 2017-10-31: 120 ug via INTRAVENOUS

## 2017-10-31 MED ORDER — PROPOFOL 10 MG/ML IV BOLUS
INTRAVENOUS | Status: DC | PRN
  Administered 2017-10-31: 20 mg via INTRAVENOUS
  Administered 2017-10-31: 150 mg via INTRAVENOUS

## 2017-10-31 MED ORDER — ACETAMINOPHEN 160 MG/5ML PO SOLN: 325.0000 mg | ORAL | Status: DC | PRN

## 2017-10-31 MED ORDER — FENTANYL CITRATE (PF) 100 MCG/2ML IJ SOLN
INTRAMUSCULAR | Status: AC
  Administered 2017-10-31: 50 ug via INTRAVENOUS
  Filled 2017-10-31: qty 2

## 2017-10-31 MED ORDER — LIDOCAINE 2% (20 MG/ML) 5 ML SYRINGE
INTRAMUSCULAR | Status: AC
  Filled 2017-10-31: qty 5

## 2017-10-31 MED ORDER — ACETAMINOPHEN 325 MG PO TABS: 325.0000 mg | ORAL_TABLET | ORAL | Status: DC | PRN

## 2017-10-31 MED ORDER — ROCURONIUM BROMIDE 10 MG/ML (PF) SYRINGE
PREFILLED_SYRINGE | INTRAVENOUS | Status: DC | PRN
  Administered 2017-10-31: 50 mg via INTRAVENOUS

## 2017-10-31 MED ORDER — MIDAZOLAM HCL 2 MG/2ML IJ SOLN
INTRAMUSCULAR | Status: AC
  Filled 2017-10-31: qty 2

## 2017-10-31 MED ORDER — SENNOSIDES-DOCUSATE SODIUM 8.6-50 MG PO TABS: 1.0000 | ORAL_TABLET | Freq: Two times a day (BID) | ORAL | Status: DC | PRN

## 2017-10-31 MED ORDER — BUPIVACAINE-EPINEPHRINE 0.25% -1:200000 IJ SOLN
INTRAMUSCULAR | Status: DC | PRN
  Administered 2017-10-31: 9 mL

## 2017-10-31 MED ORDER — PHENYLEPHRINE 40 MCG/ML (10ML) SYRINGE FOR IV PUSH (FOR BLOOD PRESSURE SUPPORT)
PREFILLED_SYRINGE | INTRAVENOUS | Status: AC
  Filled 2017-10-31: qty 10

## 2017-10-31 MED ORDER — SUGAMMADEX SODIUM 200 MG/2ML IV SOLN
INTRAVENOUS | Status: DC | PRN
  Administered 2017-10-31: 150 mg via INTRAVENOUS

## 2017-10-31 MED ORDER — ROCURONIUM BROMIDE 10 MG/ML (PF) SYRINGE
PREFILLED_SYRINGE | INTRAVENOUS | Status: AC
  Filled 2017-10-31: qty 5

## 2017-10-31 MED ORDER — PROPOFOL 10 MG/ML IV BOLUS
INTRAVENOUS | Status: AC
  Filled 2017-10-31: qty 20

## 2017-10-31 MED ORDER — FENTANYL CITRATE (PF) 100 MCG/2ML IJ SOLN: 25.0000 ug | INTRAMUSCULAR | Status: DC | PRN

## 2017-10-31 MED ORDER — SUGAMMADEX SODIUM 200 MG/2ML IV SOLN
INTRAVENOUS | Status: AC
  Filled 2017-10-31: qty 2

## 2017-10-31 MED ORDER — OXYCODONE HCL 5 MG PO TABS: 5.0000 mg | ORAL_TABLET | Freq: Once | ORAL | Status: DC | PRN

## 2017-10-31 MED ORDER — OXYCODONE HCL 5 MG PO TABS
5.0000 mg | ORAL_TABLET | ORAL | Status: DC | PRN
  Administered 2017-10-31 – 2017-11-01 (×4): 10 mg via ORAL
  Filled 2017-10-31 (×4): qty 2

## 2017-10-31 MED ORDER — FENTANYL CITRATE (PF) 100 MCG/2ML IJ SOLN
INTRAMUSCULAR | Status: DC | PRN
  Administered 2017-10-31: 100 ug via INTRAVENOUS
  Administered 2017-10-31: 50 ug via INTRAVENOUS
  Administered 2017-10-31: 100 ug via INTRAVENOUS

## 2017-10-31 MED ORDER — SODIUM CHLORIDE 0.9 % IR SOLN
Status: DC | PRN
  Administered 2017-10-31: 1000 mL

## 2017-10-31 MED ORDER — IOPAMIDOL (ISOVUE-300) INJECTION 61%
INTRAVENOUS | Status: AC
  Filled 2017-10-31: qty 50

## 2017-10-31 MED ORDER — DEXAMETHASONE SODIUM PHOSPHATE 10 MG/ML IJ SOLN
INTRAMUSCULAR | Status: DC | PRN
  Administered 2017-10-31: 10 mg via INTRAVENOUS

## 2017-10-31 MED ORDER — ONDANSETRON HCL 4 MG/2ML IJ SOLN
INTRAMUSCULAR | Status: AC
  Filled 2017-10-31: qty 2

## 2017-10-31 SURGICAL SUPPLY — 40 items
APPLIER CLIP 5 13 M/L LIGAMAX5 (MISCELLANEOUS)
BLADE CLIPPER SURG (BLADE) ×3 IMPLANT
CANISTER SUCT 3000ML PPV (MISCELLANEOUS) ×3 IMPLANT
CHLORAPREP W/TINT 26ML (MISCELLANEOUS) ×3 IMPLANT
CLIP APPLIE 5 13 M/L LIGAMAX5 (MISCELLANEOUS) IMPLANT
COVER SURGICAL LIGHT HANDLE (MISCELLANEOUS) ×3 IMPLANT
DERMABOND ADVANCED (GAUZE/BANDAGES/DRESSINGS) ×2
DERMABOND ADVANCED .7 DNX12 (GAUZE/BANDAGES/DRESSINGS) ×1 IMPLANT
ELECT REM PT RETURN 9FT ADLT (ELECTROSURGICAL) ×3
ELECTRODE REM PT RTRN 9FT ADLT (ELECTROSURGICAL) ×1 IMPLANT
FILTER SMOKE EVAC LAPAROSHD (FILTER) IMPLANT
GLOVE BIO SURGEON STRL SZ8 (GLOVE) ×3 IMPLANT
GLOVE BIOGEL PI IND STRL 8 (GLOVE) ×1 IMPLANT
GLOVE BIOGEL PI INDICATOR 8 (GLOVE) ×2
GOWN STRL REUS W/ TWL LRG LVL3 (GOWN DISPOSABLE) ×2 IMPLANT
GOWN STRL REUS W/ TWL XL LVL3 (GOWN DISPOSABLE) ×1 IMPLANT
GOWN STRL REUS W/TWL LRG LVL3 (GOWN DISPOSABLE) ×4
GOWN STRL REUS W/TWL XL LVL3 (GOWN DISPOSABLE) ×2
KIT BASIN OR (CUSTOM PROCEDURE TRAY) ×3 IMPLANT
KIT TURNOVER KIT B (KITS) ×3 IMPLANT
L-HOOK LAP DISP 36CM (ELECTROSURGICAL) ×3
LHOOK LAP DISP 36CM (ELECTROSURGICAL) ×1 IMPLANT
NEEDLE 22X1 1/2 (OR ONLY) (NEEDLE) ×3 IMPLANT
NS IRRIG 1000ML POUR BTL (IV SOLUTION) ×3 IMPLANT
PAD ARMBOARD 7.5X6 YLW CONV (MISCELLANEOUS) ×3 IMPLANT
PENCIL BUTTON HOLSTER BLD 10FT (ELECTRODE) ×3 IMPLANT
POUCH RETRIEVAL ECOSAC 10 (ENDOMECHANICALS) ×1 IMPLANT
POUCH RETRIEVAL ECOSAC 10MM (ENDOMECHANICALS) ×2
SCISSORS LAP 5X35 DISP (ENDOMECHANICALS) ×3 IMPLANT
SET IRRIG TUBING LAPAROSCOPIC (IRRIGATION / IRRIGATOR) ×3 IMPLANT
SLEEVE ENDOPATH XCEL 5M (ENDOMECHANICALS) ×6 IMPLANT
SPECIMEN JAR SMALL (MISCELLANEOUS) ×3 IMPLANT
SUT VIC AB 4-0 PS2 27 (SUTURE) ×3 IMPLANT
TOWEL OR 17X24 6PK STRL BLUE (TOWEL DISPOSABLE) ×3 IMPLANT
TOWEL OR 17X26 10 PK STRL BLUE (TOWEL DISPOSABLE) ×3 IMPLANT
TRAY LAPAROSCOPIC MC (CUSTOM PROCEDURE TRAY) ×3 IMPLANT
TROCAR XCEL BLUNT TIP 100MML (ENDOMECHANICALS) ×3 IMPLANT
TROCAR XCEL NON-BLD 5MMX100MML (ENDOMECHANICALS) ×3 IMPLANT
TUBING INSUFFLATION (TUBING) ×3 IMPLANT
WATER STERILE IRR 1000ML POUR (IV SOLUTION) ×3 IMPLANT

## 2017-10-31 NOTE — Anesthesia Procedure Notes (Signed)
Procedure Name: Intubation Date/Time: 10/31/2017 11:08 AM Performed by: Barrington Ellison, CRNA Pre-anesthesia Checklist: Patient identified, Emergency Drugs available, Suction available and Patient being monitored Patient Re-evaluated:Patient Re-evaluated prior to induction Oxygen Delivery Method: Circle System Utilized Preoxygenation: Pre-oxygenation with 100% oxygen Induction Type: IV induction Ventilation: Mask ventilation without difficulty Laryngoscope Size: Mac and 4 Grade View: Grade I Tube type: Oral Tube size: 7.5 mm Number of attempts: 1 Airway Equipment and Method: Stylet and Oral airway Placement Confirmation: ETT inserted through vocal cords under direct vision,  positive ETCO2 and breath sounds checked- equal and bilateral Secured at: 23 cm Tube secured with: Tape Dental Injury: Teeth and Oropharynx as per pre-operative assessment

## 2017-10-31 NOTE — Progress Notes (Signed)
Pt refused CHG bath at this time. Pt was informed that they will need to complete CHG wipe down before they are taken down for surgery. Pt agreed to do Chg wipe down at a later time, but before surgery.

## 2017-10-31 NOTE — Progress Notes (Signed)
[facility] PROGRESS NOTE  Brief Narrative: Jonathon Obrien is a 52 y.o. male with a history of cocaine and tobacco use, HTN, chronic back pain and anterior aortic arch dilatation who presented with RUQ and right chest pain associated with fever, nausea and vomiting. Medications improved symptoms in ED. CT abd/pelvis showed CBD dilatation to 9mm with mild intrahepatic ductal dilatation without evident stone. Gallbladder wall thickening was noted and confirmed on RUQ U/S with pericholecystic fluid, again no stones. No murphy's sign on exam. LFTs and lipase wnl, no leukocytosis. Surgery consulted and HIDA scan was ordered. No uptake into gallbladder was noted so cholecystectomy is scheduled. MRI to follow up previously seen pancreatic lesion showed no such lesions and demonstrated cystic duct stone.  Subjective: Perseverating again on delays in diet trays yesterday. Wiping with CHG preop this AM. No fevers, chills. Abd pain stable, maybe a bit better. Wants stool softener.   Objective: BP (!) 143/79 (BP Location: Left Arm)   Pulse 71   Temp 98.1 F (36.7 C) (Oral)   Resp 18   Ht  (1.956 m)   Wt 73 kg (161 lb)   SpO2 97%   BMI 19.09 kg/m   Gen: Thin 51yo male sitting up on bed in no distress. Pulm: Clear, nonlabored on room air CV: RRR, no m/r/g, edema, or JVD. No heaves. GI: Right upper > lower abd tenderness without distention, rebound or guarding. +BS.  Assessment & Plan: Acute cholecystitis and obstructive cystic duct cholelithiasis: With referred pain to chest, associated with N/V/D. - For lap chole today per surgery, NPO. - Pain medications as ordered, added bowel regimen to prevent constipation.  - Post-op diet per general surgery.  - Antiemetics ordered - Zosyn  Pancreatic lesion: Noted on CTA C/A/P 3/30: There is a focal area of relative hypoenhancement at the level of the head of pancreas measuring 2.1 cm. Follow up MRI 5/17 showed no pancreatic lesions.  Atypical  chest pain: suspect referred from intraabdominal pathology. Troponins negative, no ischemic changes on ECG.  - No further evaluation planned. Can DC telemetry.  HTN: LVH noted on ECG. - Continue lisinopril, may need augmented regimen pending BPs postop (possibly up from pain)  Tobacco use:  - Cessation counseling provided - Nicotine patch ordered  Cocaine use: +UDS. - Cessation counseling provided.   [name], MD [facility] Pager [phone] 10/31/2017, 10:59 AM

## 2017-10-31 NOTE — Progress Notes (Signed)
Pt continuing to c/o 7/10 pain in RUQ. On call NP Schorr notified. Stated it was okay to give pt additional 2 mg dose of morphine.

## 2017-10-31 NOTE — Progress Notes (Signed)
Pt alert and oriented x4, no complaints of pain or discomfort.  Bed in low position, call bell within reach.  Bed alarms on and functioning.  Assessment done and charted.  Will continue to monitor and do hourly rounding throughout the shift 

## 2017-10-31 NOTE — Transfer of Care (Signed)
Immediate Anesthesia Transfer of Care Note  Patient: Jonathon Obrien  Procedure(s) Performed: LAPAROSCOPIC CHOLECYSTECTOMY (N/A Abdomen)  Patient Location: PACU  Anesthesia Type:General  Level of Consciousness: lethargic and responds to stimulation  Airway & Oxygen Therapy: Patient Spontanous Breathing and Patient connected to nasal cannula oxygen  Post-op Assessment: Report given to RN and Post -op Vital signs reviewed and stable  Post vital signs: Reviewed and stable  Last Vitals:  Vitals Value Taken Time  BP 165/95 10/31/2017 12:27 PM  Temp    Pulse 79 10/31/2017 12:28 PM  Resp 16 10/31/2017 12:28 PM  SpO2 95 % 10/31/2017 12:28 PM  Vitals shown include unvalidated device data.  Last Pain:  Vitals:   10/31/17 0840  TempSrc:   PainSc: 7       Patients Stated Pain Goal: 3 (10/31/17 0840)  Complications: No apparent anesthesia complications

## 2017-10-31 NOTE — Op Note (Signed)
10/27/2017 - 10/31/2017  12:03 PM  PATIENT:  Jonathon Obrien  52 y.o. male  PRE-OPERATIVE DIAGNOSIS:  Acute Cholecystitis  POST-OPERATIVE DIAGNOSIS:  Acute Cholecystitis  PROCEDURE:  Procedure(s): LAPAROSCOPIC CHOLECYSTECTOMY  SURGEON:  Surgeon(s): Violeta Gelinas, MD  ASSISTANTS: none   ANESTHESIA:   local and general  EBL:  Total I/O In: 472.9 [I.V.:472.9] Out: 320 [Urine:300; Blood:20]  BLOOD ADMINISTERED:none  DRAINS: none   SPECIMEN:  Excision  DISPOSITION OF SPECIMEN:  PATHOLOGY  COUNTS:  YES  DICTATION: .Dragon Dictation Findings: Acute cholecystitis with cholelithiasis  Procedure in detail: [name] presents for cholecystectomy.  He was identified in the preop holding area.  Informed consent was obtained.  He received intravenous antibiotics.  He was brought to the operating room and general endotracheal anesthesia was administered by the anesthesia staff.  His abdomen was prepped and draped in sterile fashion.  We did a timeout procedure.The infraumbilical region was infiltrated with local. Infraumbilical incision was made. Subcutaneous tissues were dissected down revealing the anterior fascia. This was divided sharply along the midline. Peritoneal cavity was entered under direct vision without complication. A 0 Vicryl pursestring was placed around the fascial opening. Hassan trocar was inserted into the abdomen. The abdomen was insufflated with carbon dioxide in standard fashion. Under direct vision a 5 mm epigastric and 5 mm right abdominal port x2 were placed.  Local was used at each port site.  Expiration revealed an acutely inflamed gallbladder with omental adhesions attached to it.  These adhesions were taken down with cautery.  The dome of the gallbladder was then retracted superior medially.  The infundibulum was covered by the duodenum which was adhesed to there.  The duodenum was gently swept away revealing the infundibulum.  The infundibulum was retracted  inferior laterally.  Dissection began laterally and progressed medially identifying the cystic duct.  This was dissected until we had achieved a critical view of safety.  Once this was achieved, 3 clips were placed proximally and one was placed distally and the cystic duct was divided.  Further dissection revealed an anterior and posterior branch of the cystic artery.  These were dissected out and clipped proximally and divided.  The gallbladder was taken off the liver bed using Bovie cautery.  It was placed in a bag and removed from the abdomen.  It was sent to pathology.  The liver bed was cauterized to get excellent hemostasis.  Clips remain in good position.  The area was irrigated.  Ports were then removed under direct vision.  Pneumoperitoneum was released.  The infraumbilical port site was closed by tying the pursestring.  All 4 wounds were irrigated and the skin which was closed with 4-0 Monocryl followed by Dermabond.  All counts were correct and he was taken recovery in stable condition.  There were no apparent comp occasions.  PATIENT DISPOSITION:  PACU - hemodynamically stable.   Delay start of Pharmacological VTE agent (>24hrs) due to surgical blood loss or risk of bleeding:  no  Violeta Gelinas, MD, MPH, FACS Pager: [phone]  5/18/201912:03 PM

## 2017-10-31 NOTE — Anesthesia Preprocedure Evaluation (Addendum)
Anesthesia Evaluation  Patient identified by MRN, date of birth, ID band Patient awake    Reviewed: Allergy & Precautions, NPO status , Patient's Chart, lab work & pertinent test results  History of Anesthesia Complications Negative for: history of anesthetic complications  Airway Mallampati: II  TM Distance: >3 FB Neck ROM: Full    Dental  (+) Teeth Intact, Dental Advisory Given   Pulmonary neg shortness of breath, neg COPD, neg recent URI, Current Smoker,    breath sounds clear to auscultation       Cardiovascular hypertension, Pt. on medications  Rhythm:Regular     Neuro/Psych negative neurological ROS  negative psych ROS   GI/Hepatic (+)     substance abuse  cocaine use, Cholecystitis    Endo/Other  negative endocrine ROS  Renal/GU negative Renal ROS     Musculoskeletal negative musculoskeletal ROS (+)   Abdominal   Peds  Hematology negative hematology ROS (+)   Anesthesia Other Findings   Reproductive/Obstetrics                            Anesthesia Physical Anesthesia Plan  ASA: II  Anesthesia Plan: General   Post-op Pain Management:    Induction: Intravenous  PONV Risk Score and Plan: 1 and Ondansetron  Airway Management Planned: Oral ETT  Additional Equipment: None  Intra-op Plan:   Post-operative Plan: Extubation in OR  Informed Consent: I have reviewed the patients History and Physical, chart, labs and discussed the procedure including the risks, benefits and alternatives for the proposed anesthesia with the patient or authorized representative who has indicated his/her understanding and acceptance.   Dental advisory given  Plan Discussed with: CRNA and Surgeon  Anesthesia Plan Comments:         Anesthesia Quick Evaluation

## 2017-10-31 NOTE — Anesthesia Postprocedure Evaluation (Signed)
Anesthesia Post Note  Patient: Mikai Meints  Procedure(s) Performed: LAPAROSCOPIC CHOLECYSTECTOMY (N/A Abdomen)     Patient location during evaluation: PACU Anesthesia Type: General Level of consciousness: awake and alert Pain management: pain level controlled Vital Signs Assessment: post-procedure vital signs reviewed and stable Respiratory status: spontaneous breathing, nonlabored ventilation, respiratory function stable and patient connected to nasal cannula oxygen Cardiovascular status: blood pressure returned to baseline and stable Postop Assessment: no apparent nausea or vomiting Anesthetic complications: no    Last Vitals:  Vitals:   10/31/17 1444 10/31/17 1820  BP: (!) 145/91 (!) 160/101  Pulse: 71 83  Resp:    Temp: (!) 36.4 C 37.5 C  SpO2: 100% 100%    Last Pain:  Vitals:   10/31/17 1820  TempSrc: Oral  PainSc:                  Kailin Principato

## 2017-10-31 NOTE — Progress Notes (Signed)
Pt returned from PACU via bed to Rm 2W22.  Alert and oriented.  Pt on O2@2L /min [place].  Got pt settled in bed and gave pt some clear liquids.  Wants to order food.  Instructed that he had to have some clears and fulls to make sure he could tolerate them and then he can see about eating more solid food.  Verbalized understanding but continues to ask about food every time enter room.  Pt got out of bed and voided 600 cc of clear yellow urine.  Tolerated well.  Pt insists he receive Morphine.  Morphine given; see MAR.  Will continue to monitor.

## 2017-10-31 NOTE — Discharge Instructions (Addendum)

## 2017-10-31 NOTE — Progress Notes (Signed)
Day of Surgery   Subjective/Chief Complaint: Less RUQ pain-   Objective: Vital signs in last 24 hours: Temp:  [98.1 F (36.7 C)-98.8 F (37.1 C)] 98.1 F (36.7 C) (05/18 0000) Pulse Rate:  [70-71] 71 (05/18 0000) Resp:  [18] 18 (05/18 0000) BP: (143-145)/(79-87) 143/79 (05/18 0000) SpO2:  [97 %-100 %] 97 % (05/18 0000) Last BM Date: 11/05/17  Intake/Output from previous day: 05/17 0701 - 05/18 0700 In: 3625 [P.O.:500; I.V.:2875; IV Piggyback:250] Out: 2650 [Urine:2650] Intake/Output this shift: Total I/O In: 472.9 [I.V.:472.9] Out: 300 [Urine:300]  General appearance: alert and cooperative Resp: clear to auscultation bilaterally Cardio: regular rate and rhythm GI: soft, mild RUQ tenderness  Lab Results:  Recent Labs    10/29/17 0250  WBC 5.4  HGB 13.7  HCT 44.2  PLT 237   BMET Recent Labs    10/29/17 0250 10/30/17 0325  NA 142 144  K 4.5 4.4  CL 102 108  CO2 31 30  GLUCOSE 70 106*  BUN 9 9  CREATININE 1.26* 1.01  CALCIUM 8.9 8.7*   PT/INR No results for input(s): LABPROT, INR in the last 72 hours. ABG No results for input(s): PHART, HCO3 in the last 72 hours.  Invalid input(s): PCO2, PO2  Studies/Results: Mr Abdomen W Wo Contrast  Result Date: 10/30/2017 CLINICAL DATA:  Evaluate pancreatic lesion.  Severe abdominal pain. EXAM: MRI ABDOMEN WITHOUT AND WITH CONTRAST TECHNIQUE: Multiplanar multisequence MR imaging of the abdomen was performed both before and after the administration of intravenous contrast. CONTRAST:  19mL MULTIHANCE GADOBENATE DIMEGLUMINE 529 MG/ML IV SOLN COMPARISON:  CT AP 10/28/2017 FINDINGS: Lower chest: No acute findings. Hepatobiliary: No suspicious liver abnormality identified. There is a large stone within the cystic duct measuring 1.2 cm. Mild diffuse gallbladder wall thickening noted. No significant intrahepatic biliary dilatation. Mild increase caliber of the CBD measures 7 mm. Pancreas:  The pancreas is normal.  No  inflammation or mass. Spleen:  Normal appearance of the spleen. Adrenals/Urinary Tract: The adrenal glands are normal. Normal appearance of the right kidney. No mass or hydronephrosis. Stones within the inferior pole of the left kidney again noted. Left-sided pelvocaliectasis is identified without evidence for high-grade obstructive uropathy. Stomach/Bowel: Visualized portions within the abdomen are unremarkable. Vascular/Lymphatic: Normal appearance of the abdominal aorta. No aneurysm. No upper abdominal adenopathy. Other:  A small volume of perihepatic free fluid is identified. Musculoskeletal: No suspicious bone lesions identified. IMPRESSION: 1. Stone within the cystic duct measures 1.2 cm. Mild diffuse gallbladder wall thickening noted. Cannot rule out acute cholecystitis. 2. Normal appearance of the pancreas. 3. Trace perihepatic free fluid. Electronically Signed   By: Signa Kell M.D.   On: 10/30/2017 11:41   Nm Hepatobiliary Liver Func  Result Date: 10/29/2017 CLINICAL DATA:  52 year old male with history of right upper quadrant abdominal pain. Suspected acute cholecystitis. EXAM: NUCLEAR MEDICINE HEPATOBILIARY IMAGING TECHNIQUE: Sequential images of the abdomen were obtained out to 60 minutes following intravenous administration of radiopharmaceutical. RADIOPHARMACEUTICALS:  5.25 mCi Tc-87m  Choletec IV COMPARISON:  10/28/2017. FINDINGS: Prompt uptake and biliary excretion of activity by the liver is seen. Gallbladder activity was not visualized, concerning for occlusion of the cystic duct. Biliary activity passes into small bowel, consistent with patent common bile duct. 2.9 mg of morphine was administered IV, after which additional imaging was performed for 30 minutes. No accumulation of radiotracer within the gallbladder was noted during the examination. IMPRESSION: 1. Nonvisualization of gallbladder concerning for occlusion of the cystic duct and potential acute  cholecystitis. 2. Patent common  bile duct These results will be called to the ordering clinician or representative by the Radiologist Assistant, and communication documented in the PACS or zVision Dashboard. Electronically Signed   By: Trudie Reed M.D.   On: 10/29/2017 14:25    Anti-infectives: Anti-infectives (From admission, onward)   Start     Dose/Rate Route Frequency Ordered Stop   10/29/17 1700  [MAR Hold]  piperacillin-tazobactam (ZOSYN) IVPB 3.375 g     (MAR Hold since Sat 10/31/2017 at 0943. Reason: Transfer to a Procedural area.)   3.375 g 12.5 mL/hr over 240 Minutes Intravenous Every 8 hours 10/29/17 1628        Assessment/Plan: Cholecystitis - for laparoscopic cholecystectomy. I discussed the procedure, risks, and benefits. He agrees. On Zosyn.  LOS: 3 days    Liz Malady 10/31/2017

## 2017-11-01 ENCOUNTER — Encounter (HOSPITAL_COMMUNITY): Payer: Self-pay | Admitting: General Surgery

## 2017-11-01 LAB — CBC
HEMATOCRIT: 39.1 % (ref 39.0–52.0)
Hemoglobin: 12.1 g/dL — ABNORMAL LOW (ref 13.0–17.0)
MCH: 26.5 pg (ref 26.0–34.0)
MCHC: 30.9 g/dL (ref 30.0–36.0)
MCV: 85.7 fL (ref 78.0–100.0)
Platelets: 218 10*3/uL (ref 150–400)
RBC: 4.56 MIL/uL (ref 4.22–5.81)
RDW: 14.6 % (ref 11.5–15.5)
WBC: 11.5 10*3/uL — AB (ref 4.0–10.5)

## 2017-11-01 LAB — BASIC METABOLIC PANEL
Anion gap: 8 (ref 5–15)
BUN: 12 mg/dL (ref 6–20)
CALCIUM: 8.8 mg/dL — AB (ref 8.9–10.3)
CO2: 28 mmol/L (ref 22–32)
CREATININE: 0.93 mg/dL (ref 0.61–1.24)
Chloride: 104 mmol/L (ref 101–111)
GFR calc non Af Amer: >60 mL/min (ref >60)
Glucose, Bld: 125 mg/dL — ABNORMAL HIGH (ref 65–99)
Potassium: 4.1 mmol/L (ref 3.5–5.1)
Sodium: 140 mmol/L (ref 135–145)

## 2017-11-01 LAB — GLUCOSE, CAPILLARY: Glucose-Capillary: 99 mg/dL (ref 65–99)

## 2017-11-01 MED ORDER — BISACODYL 5 MG PO TBEC
10.0000 mg | DELAYED_RELEASE_TABLET | Freq: Once | ORAL | Status: AC
  Administered 2017-11-01: 10 mg via ORAL
  Filled 2017-11-01: qty 2

## 2017-11-01 MED ORDER — LISINOPRIL 40 MG PO TABS: 40.0000 mg | ORAL_TABLET | Freq: Every day | ORAL | Status: DC

## 2017-11-01 MED ORDER — MORPHINE SULFATE (PF) 2 MG/ML IV SOLN
2.0000 mg | INTRAVENOUS | Status: DC | PRN
  Administered 2017-11-01 – 2017-11-02 (×3): 2 mg via INTRAVENOUS
  Filled 2017-11-01 (×3): qty 1

## 2017-11-01 MED ORDER — POLYETHYLENE GLYCOL 3350 17 G PO PACK
17.0000 g | PACK | Freq: Every day | ORAL | Status: DC
  Administered 2017-11-01: 17 g via ORAL
  Filled 2017-11-01: qty 1

## 2017-11-01 NOTE — Progress Notes (Addendum)
Subjective OR yesterday for lap chole. Doing ok since - did not take any pain medications overnight per patient and had abdominal discomfort - took pain med this AM and feels all better. Tolerated liquids without n/v. Ambulating. +Flatus. No BM.  Objective: Vital signs in last 24 hours: Temp:  [97.5 F (36.4 C)-99.5 F (37.5 C)] 99.2 F (37.3 C) (05/19 0015) Pulse Rate:  [67-93] 93 (05/19 0015) Resp:  [8-23] 16 (05/19 0015) BP: (140-174)/(91-105) 155/98 (05/19 0015) SpO2:  [18 %-100 %] 100 % (05/19 0015) Last BM Date: 10/28/17  Intake/Output from previous day: 05/18 0701 - 05/19 0700 In: 2481.7 [P.O.:240; I.V.:2091.7; IV Piggyback:150] Out: 670 [Urine:650; Blood:20] Intake/Output this shift: No intake/output data recorded.  Gen: NAD, comfortable CV: RRR Pulm: Normal work of breathing Abd: Soft, NT/ND; incisions c/d/i without erythema Ext: SCDs in place  Lab Results: CBC  Recent Labs    11/01/17 0719  WBC 11.5*  HGB 12.1*  HCT 39.1  PLT 218   BMET Recent Labs    10/30/17 0325 11/01/17 0719  NA 144 140  K 4.4 4.1  CL 108 104  CO2 30 28  GLUCOSE 106* 125*  BUN 9 12  CREATININE 1.01 0.93  CALCIUM 8.7* 8.8*   PT/INR No results for input(s): LABPROT, INR in the last 72 hours. ABG No results for input(s): PHART, HCO3 in the last 72 hours.  Invalid input(s): PCO2, PO2  Studies/Results:  Anti-infectives: Anti-infectives (From admission, onward)   Start     Dose/Rate Route Frequency Ordered Stop   10/29/17 1700  piperacillin-tazobactam (ZOSYN) IVPB 3.375 g     3.375 g 12.5 mL/hr over 240 Minutes Intravenous Every 8 hours 10/29/17 1628         Assessment/Plan: Patient Active Problem List   Diagnosis Date Noted  . Cystic duct calculus 10/31/2017  . Essential hypertension 10/28/2017  . Cocaine abuse (HCC) 10/28/2017  . Chest pain 10/28/2017  . Dehydration   . Acute cholecystitis   . Aortic atherosclerosis (HCC) 09/12/2017  . Pulmonary nodule in R  middle lobe 09/12/2017  . Aortic dilatation (HCC) of anterior arch 09/12/2017  . Polysubstance abuse (HCC)  09/12/2017  . Tobacco use 09/12/2017   s/p Procedure(s): LAPAROSCOPIC CHOLECYSTECTOMY 10/31/2017  -Miralax, dulcolax -Diet as tolerated -If pain controlled on oral analgesics, ok for discharge home later today - only restriction will be heavy lifting - no more than 10lbs for the next 6 weeks. Follow-up in [facility] arranged with [facility] - will plan to see in 2-3 weeks.   LOS: 4 days   Stephanie Coup. Cliffton Asters, M.D. [facility]

## 2017-11-01 NOTE — Progress Notes (Signed)
[facility] PROGRESS NOTE  Brief Narrative: Jonathon Obrien is a 52 y.o. male with a history of cocaine and tobacco use, HTN, chronic back pain and anterior aortic arch dilatation who presented with RUQ and right chest pain associated with fever, nausea and vomiting. Medications improved symptoms in ED. CT abd/pelvis showed CBD dilatation to 9mm with mild intrahepatic ductal dilatation without evident stone. Gallbladder wall thickening was noted and confirmed on RUQ U/S with pericholecystic fluid, again no stones. No murphy's sign on exam. LFTs and lipase wnl, no leukocytosis. Surgery consulted and HIDA scan was ordered. No uptake into gallbladder was noted so cholecystectomy is scheduled. MRI to follow up previously seen pancreatic lesion showed no such lesions and demonstrated cystic duct stone. Laparoscopic cholecystectomy was performed 5/18.  Subjective: Pain is severe, improved only transiently with morphine. +flatus, no BM yet. Tolerating solid diet.  Objective: BP (!) 155/98 (BP Location: Right Arm)   Pulse 93   Temp 99.2 F (37.3 C) (Oral)   Resp 16   Ht  (1.956 m)   Wt 73 kg (161 lb)   SpO2 100%   BMI 19.09 kg/m   Gen: Thin 51yo male in no distress Pulm: Clear, nonlabored on room air CV: RRR, no m/r/g, edema, or JVD. No heaves. GI: Soft, Right sided tenderness with dermabond on laparoscopic incision sites x4. +BS.  Assessment & Plan: Acute cholecystitis and obstructive cystic duct cholelithiasis: s/p lap cholecystectomy 5/18.  - Diet has been advanced, +flatus no BM yet.  - Urged to use only po medications in effort to discharge sooner.  - Continue bowel regimen  Pancreatic lesion: Noted on CTA C/A/P 3/30: There is a focal area of relative hypoenhancement at the level of the head of pancreas measuring 2.1 cm. Follow up MRI 5/17 showed no pancreatic lesions.  Atypical chest pain: suspect referred from intraabdominal pathology. Troponins negative, no ischemic changes  on ECG.  - No further evaluation planned.   HTN: LVH noted on ECG. - Increase lisinopril  >  today with elevated BPs. Needs PCP follow up.  Tobacco use:  - Cessation counseling provided again today. - Nicotine patch ordered  Cocaine use: +UDS. - Cessation counseling provided.   [name], MD [facility] 11/01/2017, 9:19 AM

## 2017-11-02 DIAGNOSIS — K8020 Calculus of gallbladder without cholecystitis without obstruction: Secondary | ICD-10-CM

## 2017-11-02 LAB — GLUCOSE, CAPILLARY: Glucose-Capillary: 93 mg/dL (ref 65–99)

## 2017-11-02 MED ORDER — OXYCODONE HCL 5 MG PO TABS: 5.0000 mg | ORAL_TABLET | Freq: Four times a day (QID) | ORAL | 0 refills | Status: AC | PRN

## 2017-11-02 NOTE — Progress Notes (Signed)
Patient being discharged.  Incisions are clean and dry.  Okay to go home from surgical standpoint.  Follow up information should be on the chart.  Marta Lamas. Gae Bon, MD, FACS [phone] [facility]

## 2017-11-02 NOTE — Progress Notes (Signed)
..  Patient given all discharge paper work. All pages reviewed and subsequent questions answered. Prescriptions given to patient. Patientsperipheral IV's discontinued without complications. Patients belongings returned. Patient placed in wheel chair and escorted by staff member to pick up location.   Social worker

## 2017-11-02 NOTE — Discharge Summary (Signed)
Physician Discharge Summary  Jonathon Obrien ZOX:[id number] DOB: [birth year] DOA: 10/27/2017  PCP: Patient, No Pcp Per  Admit date: 10/27/2017 Discharge date: 11/02/2017  Admitted From: Home Disposition: Home   Recommendations for Outpatient Follow-up:  1. Follow up with surgery as scheduled for postop appointment.  Home Health: None Equipment/Devices: None Discharge Condition: Stable CODE STATUS: Full Diet recommendation: As tolerated  Brief/Interim Summary: Jonathon Obrien is a 52 y.o. male with a history of cocaine and tobacco use, HTN, chronic back pain and anterior aortic arch dilatation who presented with RUQ and right chest pain associated with fever, nausea and vomiting. Medications improved symptoms in ED. CT abd/pelvis showed CBD dilatation to 9mm with mild intrahepatic ductal dilatation without evident stone. Gallbladder wall thickening was noted and confirmed on RUQ U/S with pericholecystic fluid, again no stones. No murphy's sign on exam. LFTs and lipase wnl, no leukocytosis. Surgery consulted and HIDA scan was ordered. No uptake into gallbladder was noted so cholecystectomy is scheduled. MRI to follow up previously seen pancreatic lesion showed no such lesions and demonstrated cystic duct stone. Laparoscopic cholecystectomy was performed 5/18 and post-operative course has been uncomplicated.  Discharge Diagnoses:  Principal Problem:   Acute cholecystitis Active Problems:   Polysubstance abuse (HCC)    Tobacco use   Essential hypertension   Cocaine abuse (HCC)   Chest pain   Cystic duct calculus  Acute cholecystitis and obstructive cystic duct cholelithiasis: s/p lap cholecystectomy 5/18.  - Diet has been advanced, +flatus, +BM, pain controlled on oral medications. - Follow up with surgery as scheduled.  Pancreatic lesion: Noted on CTA C/A/P 3/30: There is a focal area of relative hypoenhancement at the level of the head of pancreas measuring 2.1 cm. Follow up MRI 5/17  showed no pancreatic lesions. No further follow up is indicated.  Atypical chest pain: suspect referred from intraabdominal pathology. Troponins negative, no ischemic changes on ECG.  - No further evaluation planned.   HTN: LVH noted on ECG. - Increased lisinopril  >  while admitted, will defer to PCP follow up (CM consulted)  Tobacco use:  - Cessation counseling provided throughout hospital stay - Nicotine patch ordered while inpatient  Cocaine use: +UDS. - Cessation counseling provided.   Discharge Instructions Discharge Instructions    Diet - low sodium heart healthy   Complete by:  As directed    Discharge instructions   Complete by:  As directed    Follow up with general surgery in 2 weeks. Call the office to schedule an appointment. You may take oxycodone as needed for pain that is not relieved by aleve.   Increase activity slowly   Complete by:  As directed      Allergies as of 11/02/2017   No Known Allergies     Medication List    TAKE these medications   calcium carbonate 750 MG chewable tablet Commonly known as:  TUMS EX Chew 2 tablets by mouth as needed for heartburn.   diphenhydrAMINE 25 MG tablet Commonly known as:  BENADRYL Take 25 mg by mouth every 6 (six) hours as needed for allergies.   lisinopril 20 MG tablet Commonly known as:  PRINIVIL,ZESTRIL Take 20 mg by mouth daily.   multivitamin with minerals Tabs tablet Take 1 tablet by mouth daily.   naproxen sodium 220 MG tablet Commonly known as:  ALEVE Take 440 mg by mouth daily as needed (pain).   oxyCODONE 5 MG immediate release tablet Commonly known as:  Oxy IR/ROXICODONE Take 1-2 tablets (5-10  mg total) by mouth every 6 (six) hours as needed for up to 5 days for moderate pain or severe pain.      Follow-up Information    [facility], [place]. Call in 2 week(s).   Specialty:  General Surgery Contact information: [address] [place]  [place] [address] [phone]         No Known Allergies  Consultations:  General surgery  Procedures/Studies: Dg Chest 2 View  Result Date: 10/27/2017 CLINICAL DATA:  Acute onset of right-sided chest pain, generalized abdominal pain, nausea, vomiting and headache. EXAM: CHEST - 2 VIEW COMPARISON:  Chest radiograph and CTA of the chest performed 09/12/2017 FINDINGS: The lungs are well-aerated. Minimal scarring is noted at the left lung apex. There is no evidence of focal opacification, pleural effusion or pneumothorax. The heart is normal in size; the mediastinal contour is within normal limits. No acute osseous abnormalities are seen. IMPRESSION: No acute cardiopulmonary process seen. Electronically Signed   By: Roanna Raider M.D.   On: 10/27/2017 22:41   Mr Abdomen W Wo Contrast  Result Date: 10/30/2017 CLINICAL DATA:  Evaluate pancreatic lesion.  Severe abdominal pain. EXAM: MRI ABDOMEN WITHOUT AND WITH CONTRAST TECHNIQUE: Multiplanar multisequence MR imaging of the abdomen was performed both before and after the administration of intravenous contrast. CONTRAST:  19mL MULTIHANCE GADOBENATE DIMEGLUMINE 529 MG/ML IV SOLN COMPARISON:  CT AP 10/28/2017 FINDINGS: Lower chest: No acute findings. Hepatobiliary: No suspicious liver abnormality identified. There is a large stone within the cystic duct measuring 1.2 cm. Mild diffuse gallbladder wall thickening noted. No significant intrahepatic biliary dilatation. Mild increase caliber of the CBD measures 7 mm. Pancreas:  The pancreas is normal.  No inflammation or mass. Spleen:  Normal appearance of the spleen. Adrenals/Urinary Tract: The adrenal glands are normal. Normal appearance of the right kidney. No mass or hydronephrosis. Stones within the inferior pole of the left kidney again noted. Left-sided pelvocaliectasis is identified without evidence for high-grade obstructive uropathy. Stomach/Bowel: Visualized portions within the abdomen are  unremarkable. Vascular/Lymphatic: Normal appearance of the abdominal aorta. No aneurysm. No upper abdominal adenopathy. Other:  A small volume of perihepatic free fluid is identified. Musculoskeletal: No suspicious bone lesions identified. IMPRESSION: 1. Stone within the cystic duct measures 1.2 cm. Mild diffuse gallbladder wall thickening noted. Cannot rule out acute cholecystitis. 2. Normal appearance of the pancreas. 3. Trace perihepatic free fluid. Electronically Signed   By: Signa Kell M.D.   On: 10/30/2017 11:41   Nm Hepatobiliary Liver Func  Result Date: 10/29/2017 CLINICAL DATA:  52 year old male with history of right upper quadrant abdominal pain. Suspected acute cholecystitis. EXAM: NUCLEAR MEDICINE HEPATOBILIARY IMAGING TECHNIQUE: Sequential images of the abdomen were obtained out to 60 minutes following intravenous administration of radiopharmaceutical. RADIOPHARMACEUTICALS:  5.25 mCi Tc-8m  Choletec IV COMPARISON:  10/28/2017. FINDINGS: Prompt uptake and biliary excretion of activity by the liver is seen. Gallbladder activity was not visualized, concerning for occlusion of the cystic duct. Biliary activity passes into small bowel, consistent with patent common bile duct. 2.9 mg of morphine was administered IV, after which additional imaging was performed for 30 minutes. No accumulation of radiotracer within the gallbladder was noted during the examination. IMPRESSION: 1. Nonvisualization of gallbladder concerning for occlusion of the cystic duct and potential acute cholecystitis. 2. Patent common bile duct These results will be called to the ordering clinician or representative by the Radiologist Assistant, and communication documented in the PACS or zVision Dashboard. Electronically Signed  By: Trudie Reed M.D.   On: 10/29/2017 14:25   Ct Abdomen Pelvis W Contrast  Result Date: 10/28/2017 CLINICAL DATA:  Acute onset of right-sided abdominal pain and vomiting. Sharp right chest pain.  Back pain. EXAM: CT ABDOMEN AND PELVIS WITH CONTRAST TECHNIQUE: Multidetector CT imaging of the abdomen and pelvis was performed using the standard protocol following bolus administration of intravenous contrast. CONTRAST:  OMNIPAQUE IOHEXOL 300 MG/ML  SOLN COMPARISON:  None. FINDINGS: Lower chest: The visualized lung bases are grossly clear. The visualized portions of the mediastinum are unremarkable. Hepatobiliary: The liver is unremarkable in appearance. Diffuse gallbladder wall thickening is nonspecific. There is dilatation of the common bile duct to 9 mm in diameter, with mild intrahepatic biliary ductal dilatation. Pancreas: The pancreas is within normal limits. Spleen: The spleen is unremarkable in appearance. Adrenals/Urinary Tract: The adrenal glands are unremarkable in appearance. The kidneys are within normal limits. There is no evidence of hydronephrosis. No renal or ureteral stones are identified. No perinephric stranding is seen. Stomach/Bowel: The stomach is unremarkable in appearance. The small bowel is within normal limits. The appendix is normal in caliber, without evidence of appendicitis. The colon is unremarkable in appearance. Vascular/Lymphatic: Minimal calcification is seen along the common iliac arteries bilaterally. The abdominal aorta is grossly unremarkable. No retroperitoneal lymphadenopathy is seen. No pelvic sidewall lymphadenopathy is identified. Reproductive: The bladder is mildly distended and grossly unremarkable. The prostate remains normal in size. Other: No additional soft tissue abnormalities are seen. Musculoskeletal: No acute osseous abnormalities are identified. The visualized musculature is unremarkable in appearance. IMPRESSION: 1. Dilatation of the common bile duct to 9 mm in diameter, with mild intrahepatic biliary ductal dilatation. This is of uncertain significance. Would correlate with LFTs. MRCP or ERCP could be considered for further evaluation, when and as  deemed clinically appropriate. 2. Diffuse gallbladder wall thickening is nonspecific. Would correlate clinically for evidence of cholecystitis. Electronically Signed   By: Roanna Raider M.D.   On: 10/28/2017 03:24   Nm Hepato W/eject Fract  Result Date: 10/28/2017 CLINICAL DATA:  Right upper quadrant pain, nausea, and vomiting. EXAM: NUCLEAR MEDICINE HEPATOBILIARY IMAGING TECHNIQUE: Sequential images of the abdomen were obtained out to 60 minutes following intravenous administration of radiopharmaceutical. RADIOPHARMACEUTICALS:  5 mCi Tc-56m  Choletec IV COMPARISON:  Right upper quadrant ultrasound and CT abdomen and pelvis 10/28/2017 FINDINGS: Prompt uptake and biliary excretion of activity by the liver is seen. Biliary activity passes into small bowel, consistent with patent common bile duct. No gallbladder activity was visualized during the first 60 minutes of imaging. At that point, the patient terminated the examination. IMPRESSION: 1. Nonvisualization of the gallbladder after 1 hour of imaging, indeterminate as the patient terminated the examination at this point resulting in an incomplete examination. Acute and chronic cholecystitis remain in the differential. 2. Patent common bile duct. Electronically Signed   By: Sebastian Ache M.D.   On: 10/28/2017 12:48   US Abdomen Limited Ruq  Result Date: 10/28/2017 CLINICAL DATA:  Acute onset of right upper quadrant abdominal pain. EXAM: ULTRASOUND ABDOMEN LIMITED RIGHT UPPER QUADRANT COMPARISON:  CT of the abdomen and pelvis performed earlier today at 2:56 a.m. FINDINGS: Gallbladder: Diffuse gallbladder wall thickening is noted, measuring up to 4 mm, with associated pericholecystic fluid. No ultrasonographic Murphy's sign is elicited. No stones are identified. Common bile duct: Diameter: 0.7 cm, mildly prominent in caliber. Liver: No focal lesion identified. Within normal limits in parenchymal echogenicity. Portal vein is patent on color Doppler  imaging with  normal direction of blood flow towards the liver. IMPRESSION: Diffuse gallbladder wall thickening and associated pericholecystic fluid, of uncertain significance. No stones seen. No ultrasonographic Murphy's sign elicited. The common bile duct is mildly prominent. Given normal LFTs, obstruction is considered unlikely. Electronically Signed   By: Roanna Raider M.D.   On: 10/28/2017 05:21  Lap cholecystectomy  Subjective: Pain is controlled, bowel function returned. No fevers  Discharge Exam: Vitals:   11/02/17 0054 11/02/17 0810  BP: 131/78 (!) 141/94  Pulse: 72 72  Resp: 14 18  Temp: 98.7 F (37.1 C) 98.8 F (37.1 C)  SpO2: 100% 100%   General: Pt is alert, awake, not in acute distress Cardiovascular: RRR, S1/S2 +, no rubs, no gallops Respiratory: CTA bilaterally, no wheezing, no rhonchi Abdominal: Soft, TTP along right abdomen without rebound. Lap sites c/d/i w/dermabond. No cellulitis. Extremities: No edema, no cyanosis  Labs: BNP (last 3 results) No results for input(s): BNP in the last 8760 hours. Basic Metabolic Panel: Recent Labs  Lab 10/27/17 2218 10/29/17 0250 10/30/17 0325 11/01/17 0719  NA 140 142 144 140  K 3.8 4.5 4.4 4.1  CL 97* 102 108 104  CO2 GLUCOSE 121* 70 106* 125*  BUN CREATININE 1.27* 1.26* 1.01 0.93  CALCIUM 10.3 8.9 8.7* 8.8*   Liver Function Tests: Recent Labs  Lab 10/27/17 2218 10/29/17 0250 10/30/17 0325  AST 27 17 14*  ALT 18 14* 13*  ALKPHOS 71 48 38  BILITOT 0.8 0.3 0.4  PROT 8.8* 5.8* 5.0*  ALBUMIN 5.1* 3.4* 2.9*   Recent Labs  Lab 10/27/17 2218  LIPASE 23   No results for input(s): AMMONIA in the last 168 hours. CBC: Recent Labs  Lab 10/27/17 2218 10/29/17 0250 11/01/17 0719  WBC 9.8 5.4 11.5*  HGB 17.9* 13.7 12.1*  HCT 57.1* 44.2 39.1  MCV 83.1 85.0 85.7  PLT 222 237 218   Cardiac Enzymes: Recent Labs  Lab 10/28/17 0748 10/28/17 1240 10/28/17 1959  TROPONINI <0.03 <0.03 <0.03    BNP: Invalid input(s): POCBNP CBG: Recent Labs  Lab 10/29/17 0733 10/30/17 0739 10/31/17 0807 11/01/17 0736 11/02/17 0810  GLUCAP 88 85 105* 99 93   D-Dimer No results for input(s): DDIMER in the last 72 hours. Hgb A1c No results for input(s): HGBA1C in the last 72 hours. Lipid Profile No results for input(s): CHOL, HDL, LDLCALC, TRIG, CHOLHDL, LDLDIRECT in the last 72 hours. Thyroid function studies No results for input(s): TSH, T4TOTAL, T3FREE, THYROIDAB in the last 72 hours.  Invalid input(s): FREET3 Anemia work up No results for input(s): VITAMINB12, FOLATE, FERRITIN, TIBC, IRON, RETICCTPCT in the last 72 hours. Urinalysis    Component Value Date/Time   COLORURINE YELLOW 10/27/2017 2225   APPEARANCEUR HAZY (A) 10/27/2017 2225   LABSPEC 1.017 10/27/2017 2225   PHURINE 8.0 10/27/2017 2225   GLUCOSEU NEGATIVE 10/27/2017 2225   HGBUR SMALL (A) 10/27/2017 2225   BILIRUBINUR NEGATIVE 10/27/2017 2225   KETONESUR 80 (A) 10/27/2017 2225   PROTEINUR 100 (A) 10/27/2017 2225   NITRITE NEGATIVE 10/27/2017 2225   LEUKOCYTESUR NEGATIVE 10/27/2017 2225    Microbiology Recent Results (from the past 240 hour(s))  Surgical pcr screen     Status: None   Collection Time: 10/29/17  9:14 PM  Result Value Ref Range Status   MRSA, PCR NEGATIVE NEGATIVE Final   Staphylococcus aureus NEGATIVE NEGATIVE Final    Comment: (NOTE) The Xpert SA Assay (  FDA approved for NASAL specimens in patients 25 years of age and older), is one component of a comprehensive surveillance program. It is not intended to diagnose infection nor to guide or monitor treatment. Performed at [facility], [address], [place], [place] [address]     Time coordinating discharge: Approximately 40 minutes  [name], MD  [facility] 11/02/2017, 8:58 AM Pager [phone]

## 2017-11-07 ENCOUNTER — Emergency Department (HOSPITAL_COMMUNITY)
Admission: EM | Admit: 2017-11-07 | Discharge: 2017-11-07 | Disposition: A | Discharge status: Home / Self Care | Payer: Medicaid Other | Attending: Emergency Medicine | Admitting: Emergency Medicine

## 2017-11-07 ENCOUNTER — Emergency Department (HOSPITAL_COMMUNITY): Payer: Medicaid Other

## 2017-11-07 ENCOUNTER — Encounter (HOSPITAL_COMMUNITY): Payer: Self-pay | Admitting: Emergency Medicine

## 2017-11-07 DIAGNOSIS — F17200 Nicotine dependence, unspecified, uncomplicated: Secondary | ICD-10-CM | POA: Insufficient documentation

## 2017-11-07 DIAGNOSIS — Z79899 Other long term (current) drug therapy: Secondary | ICD-10-CM | POA: Insufficient documentation

## 2017-11-07 DIAGNOSIS — X58XXXA Exposure to other specified factors, initial encounter: Secondary | ICD-10-CM | POA: Diagnosis not present

## 2017-11-07 DIAGNOSIS — Y998 Other external cause status: Secondary | ICD-10-CM | POA: Diagnosis not present

## 2017-11-07 DIAGNOSIS — R1084 Generalized abdominal pain: Secondary | ICD-10-CM | POA: Insufficient documentation

## 2017-11-07 DIAGNOSIS — T148XXA Other injury of unspecified body region, initial encounter: Secondary | ICD-10-CM

## 2017-11-07 DIAGNOSIS — Y9389 Activity, other specified: Secondary | ICD-10-CM | POA: Diagnosis not present

## 2017-11-07 DIAGNOSIS — I10 Essential (primary) hypertension: Secondary | ICD-10-CM | POA: Insufficient documentation

## 2017-11-07 DIAGNOSIS — F1410 Cocaine abuse, uncomplicated: Secondary | ICD-10-CM | POA: Diagnosis not present

## 2017-11-07 DIAGNOSIS — Y929 Unspecified place or not applicable: Secondary | ICD-10-CM | POA: Insufficient documentation

## 2017-11-07 LAB — CBC WITH DIFFERENTIAL/PLATELET
Abs Immature Granulocytes: 0 10*3/uL (ref 0.0–0.1)
BASOS ABS: 0 10*3/uL (ref 0.0–0.1)
BASOS PCT: 0 %
EOS ABS: 0 10*3/uL (ref 0.0–0.7)
EOS PCT: 1 %
HEMATOCRIT: 42.9 % (ref 39.0–52.0)
Hemoglobin: 13.2 g/dL (ref 13.0–17.0)
IMMATURE GRANULOCYTES: 0 %
Lymphocytes Relative: 16 %
Lymphs Abs: 1.3 10*3/uL (ref 0.7–4.0)
MCH: 26.2 pg (ref 26.0–34.0)
MCHC: 30.8 g/dL (ref 30.0–36.0)
MCV: 85.3 fL (ref 78.0–100.0)
Monocytes Absolute: 0.4 10*3/uL (ref 0.1–1.0)
Monocytes Relative: 5 %
NEUTROS PCT: 78 %
Neutro Abs: 6 10*3/uL (ref 1.7–7.7)
Platelets: 302 10*3/uL (ref 150–400)
RBC: 5.03 MIL/uL (ref 4.22–5.81)
RDW: 15.2 % (ref 11.5–15.5)
WBC: 7.7 10*3/uL (ref 4.0–10.5)

## 2017-11-07 LAB — I-STAT CG4 LACTIC ACID, ED
Lactic Acid, Venous: 2.07 mmol/L (ref 0.5–1.9)
Lactic Acid, Venous: 1.24 mmol/L (ref 0.5–1.9)

## 2017-11-07 LAB — COMPREHENSIVE METABOLIC PANEL
ALT: 127 U/L — AB (ref 17–63)
AST: 39 U/L (ref 15–41)
Albumin: 3.6 g/dL (ref 3.5–5.0)
Alkaline Phosphatase: 65 U/L (ref 38–126)
Anion gap: 10 (ref 5–15)
BILIRUBIN TOTAL: 0.9 mg/dL (ref 0.3–1.2)
BUN: 12 mg/dL (ref 6–20)
CO2: 28 mmol/L (ref 22–32)
CREATININE: 1.01 mg/dL (ref 0.61–1.24)
Calcium: 9.3 mg/dL (ref 8.9–10.3)
Chloride: 104 mmol/L (ref 101–111)
GFR calc non Af Amer: >60 mL/min (ref >60)
Glucose, Bld: 121 mg/dL — ABNORMAL HIGH (ref 65–99)
Potassium: 3.8 mmol/L (ref 3.5–5.1)
Sodium: 142 mmol/L (ref 135–145)
TOTAL PROTEIN: 6.7 g/dL (ref 6.5–8.1)

## 2017-11-07 MED ORDER — IOPAMIDOL (ISOVUE-300) INJECTION 61%
INTRAVENOUS | Status: AC
  Filled 2017-11-07: qty 30

## 2017-11-07 MED ORDER — MORPHINE SULFATE (PF) 4 MG/ML IV SOLN
4.0000 mg | Freq: Once | INTRAVENOUS | Status: AC
  Administered 2017-11-07: 4 mg via INTRAVENOUS
  Filled 2017-11-07: qty 1

## 2017-11-07 MED ORDER — SODIUM CHLORIDE 0.9 % IV BOLUS
1000.0000 mL | Freq: Once | INTRAVENOUS | Status: AC
  Administered 2017-11-07: 1000 mL via INTRAVENOUS

## 2017-11-07 MED ORDER — IOHEXOL 300 MG/ML  SOLN
100.0000 mL | Freq: Once | INTRAMUSCULAR | Status: AC | PRN
  Administered 2017-11-07: 100 mL via INTRAVENOUS

## 2017-11-07 NOTE — ED Notes (Signed)
Declined W/C at D/C and was escorted to lobby by RN. 

## 2017-11-07 NOTE — ED Triage Notes (Signed)
Patient complains of right lower abdominal pain and bleeding from a one surgical site after a laparoscopic cholecystectomy on 5/18. Patient also reports a stinging sensation in right lower abdomen with urination. Denies burning or stinging in his penis.

## 2017-11-07 NOTE — ED Provider Notes (Signed)
[facility] EMERGENCY DEPARTMENT Provider Note  CSN: [id number] Arrival date & time: 11/07/17 2440  Chief Complaint(s) Wound Check  HPI Jonathon Obrien is a 52 y.o. male who recently had a lap cholecystectomy last week presents to the emergency department with continued generalized aching abdominal pain, exacerbated with movement and palpation.  Patient has tried taking his prescribed pain medication without relief.  Patient also states that he is having oozing from the infraumbilical incision.  This occurs intermittently and self resolves.  He denies any fevers, chills, nausea, vomiting, diarrhea.  No chest pain or shortness of breath.  HPI  Past Medical History Past Medical History:  Diagnosis Date  . Back pain   . Hypertension    Patient Active Problem List   Diagnosis Date Noted  . Cystic duct calculus 10/31/2017  . Essential hypertension 10/28/2017  . Cocaine abuse (HCC) 10/28/2017  . Chest pain 10/28/2017  . Dehydration   . Acute cholecystitis   . Aortic atherosclerosis (HCC) 09/12/2017  . Pulmonary nodule in R middle lobe 09/12/2017  . Aortic dilatation (HCC) of anterior arch 09/12/2017  . Polysubstance abuse (HCC)  09/12/2017  . Tobacco use 09/12/2017   Home Medication(s) Prior to Admission medications   Medication Sig Start Date End Date Taking? Authorizing Provider  calcium carbonate (TUMS EX) 750 MG chewable tablet Chew 2 tablets by mouth as needed for heartburn.    [provider]  diphenhydrAMINE (BENADRYL) 25 MG tablet Take 25 mg by mouth every 6 (six) hours as needed for allergies.    [provider]  lisinopril (PRINIVIL,ZESTRIL) 20 MG tablet Take 20 mg by mouth daily.    [provider]  Multiple Vitamin (MULTIVITAMIN WITH MINERALS) TABS tablet Take 1 tablet by mouth daily.    [provider]  naproxen sodium (ALEVE) 220 MG tablet Take 440 mg by mouth daily as needed (pain).     [provider]    oxyCODONE (OXY IR/ROXICODONE) 5 MG immediate release tablet Take 1-2 tablets (5-10 mg total) by mouth every 6 (six) hours as needed for up to 5 days for moderate pain or severe pain. 11/02/17 11/07/17  [name], MD                                                                                                                                    Past Surgical History Past Surgical History:  Procedure Laterality Date  . CHOLECYSTECTOMY N/A 10/31/2017   Procedure: LAPAROSCOPIC CHOLECYSTECTOMY;  Surgeon: Violeta Gelinas, MD;  Location: [facility] OR;  Service: General;  Laterality: N/A;  . HERNIA REPAIR    . KNEE SURGERY     Family History Family History  Problem Relation Age of Onset  . Hypertension Mother   . Hypertension Father   . Diabetes Mellitus II Father     Social History Social History   Tobacco Use  . Smoking status: Current Every Day Smoker  .  Smokeless tobacco: Never Used  Substance Use Topics  . Alcohol use: Never    Frequency: Never  . Drug use: Yes    Types: Cocaine   Allergies Patient has no known allergies.  Review of Systems Review of Systems All other systems are reviewed and are negative for acute change except as noted in the HPI  Physical Exam Vital Signs  I have reviewed the triage vital signs BP (!) 142/89 (BP Location: Right Arm)   Pulse 88   Temp 98.1 F (36.7 C) (Oral)   Resp 16   SpO2 98%   Physical Exam  Constitutional: He is oriented to person, place, and time. He appears well-developed and well-nourished. No distress.  HENT:  Head: Normocephalic and atraumatic.  Nose: Nose normal.  Eyes: Pupils are equal, round, and reactive to light. Conjunctivae and EOM are normal. Right eye exhibits no discharge. Left eye exhibits no discharge. No scleral icterus.  Neck: Normal range of motion. Neck supple.  Cardiovascular: Normal rate and regular rhythm. Exam reveals no gallop and no friction rub.  No murmur heard. Pulmonary/Chest: Effort normal and  breath sounds normal. No stridor. No respiratory distress. He has no rales.  Abdominal: Soft. He exhibits no distension. There is generalized tenderness. There is guarding. There is no rigidity and no rebound.    Numerous trocar incision sites, appear clean, intact, dry.  Musculoskeletal: He exhibits no edema or tenderness.  Neurological: He is alert and oriented to person, place, and time.  Skin: Skin is warm and dry. No rash noted. He is not diaphoretic. No erythema.  Psychiatric: He has a normal mood and affect.  Vitals reviewed.   ED Results and Treatments Labs (all labs ordered are listed, but only abnormal results are displayed) Labs Reviewed  COMPREHENSIVE METABOLIC PANEL - Abnormal; Notable for the following components:      Result Value   Glucose, Bld 121 (*)    ALT 127 (*)    All other components within normal limits  I-STAT CG4 LACTIC ACID, ED - Abnormal; Notable for the following components:   Lactic Acid, Venous 2.07 (*)    All other components within normal limits  CBC WITH DIFFERENTIAL/PLATELET  URINALYSIS, ROUTINE W REFLEX MICROSCOPIC  I-STAT CG4 LACTIC ACID, ED                                                                                                                         EKG  EKG Interpretation  Date/Time:    Ventricular Rate:    PR Interval:    QRS Duration:   QT Interval:    QTC Calculation:   R Axis:     Text Interpretation:        Radiology Ct Abdomen Pelvis W Contrast  Result Date: 11/07/2017 CLINICAL DATA:  Right lower quadrant pain after cholecystectomy. EXAM: CT ABDOMEN AND PELVIS WITH CONTRAST TECHNIQUE: Multidetector CT imaging of the abdomen and pelvis was performed using the standard protocol following bolus administration of  intravenous contrast. CONTRAST:  OMNIPAQUE IOHEXOL 300 MG/ML  SOLN COMPARISON:  CT scan of Oct 28, 2017. FINDINGS: Lower chest: No acute abnormality. Hepatobiliary: Status post cholecystectomy. Small amount  of fluid is noted in the gallbladder fossa which is expected postoperative finding. Liver is unremarkable. No significant biliary dilatation is noted. Pancreas: Unremarkable. No pancreatic ductal dilatation or surrounding inflammatory changes. Spleen: Normal in size without focal abnormality. Adrenals/Urinary Tract: Adrenal glands appear normal. Nonobstructive calculi are noted in lower pole collecting system of right kidney. No hydronephrosis or renal obstruction is noted. Urinary bladder is unremarkable. Stomach/Bowel: The stomach appears normal. There is no evidence of bowel obstruction or inflammation. The appendix is not visualized, but no inflammation is seen in the right lower quadrant. Vascular/Lymphatic: No significant vascular findings are present. No enlarged abdominal or pelvic lymph nodes. Reproductive: Prostate is unremarkable. Other: No abdominal wall hernia or abnormality. No abdominopelvic ascites. Musculoskeletal: No acute or significant osseous findings. IMPRESSION: Status post cholecystectomy. Expected postoperative changes are noted in gallbladder fossa. Nonobstructive left nephrolithiasis. No hydronephrosis or renal obstruction is noted. Electronically Signed   By: Lupita Raider, M.D.   On: 11/07/2017 14:18   Pertinent labs & imaging results that were available during my care of the patient were reviewed by me and considered in my medical decision making (see chart for details).  Medications Ordered in ED Medications  iopamidol (ISOVUE-300) 61 % injection (has no administration in time range)  sodium chloride 0.9 % bolus 1,000 mL (0 mLs Intravenous Stopped 11/07/17 1345)  morphine 4 MG/ML injection 4 mg (4 mg Intravenous Given 11/07/17 1150)  iohexol (OMNIPAQUE) 300 MG/ML solution 100 mL (100 mLs Intravenous Contrast Given 11/07/17 1349)                                                                                                                                     Procedures Procedures  (including critical care time)  Medical Decision Making / ED Course I have reviewed the nursing notes for this encounter and the patient's prior records (if available in EHR or on provided paperwork).    Abdominal pain following lap Chole.  Diffuse abdominal tenderness.  No distention.  Incisions appear clean, dry, intact.  No evidence of acute or persistent bleeding.  No evidence of superimposed infection.  Labs grossly reassuring without leukocytosis, significant electrolyte derangements, or renal insufficiency.  Given recent surgery, CT of the abdomen was obtained which did not reveal any evidence of serious intra-abdominal inflammatory/infectious process or bowel obstruction.   Patient was given IV fluids and pain medicine.  Able to tolerate oral hydration.  The patient appears reasonably screened and/or stabilized for discharge and I doubt any other medical condition or other [facility] requiring further screening, evaluation, or treatment in the ED at this time prior to discharge.  The patient is safe for discharge with strict return precautions.   Final Clinical Impression(s) / ED  Diagnoses Final diagnoses:  Generalized abdominal pain  Bleeding from wound   Disposition: Discharge  Condition: Good  I have discussed the results, Dx and Tx plan with the patient who expressed understanding and agree(s) with the plan. Discharge instructions discussed at great length. The patient was given strict return precautions who verbalized understanding of the instructions. No further questions at time of discharge.    ED Discharge Orders    None       Follow Up: Violeta Gelinas, MD [address] [place] [address] [place] [address] [phone]  Go to  As scheduled for your postop visit      This chart was dictated using voice recognition software.  Despite best efforts to proofread,  errors can occur which can change the documentation meaning.   Nira Conn, MD 11/07/17 1630

## 2017-11-07 NOTE — Discharge Instructions (Addendum)

## 2017-11-10 ENCOUNTER — Telehealth: Payer: Self-pay

## 2017-11-10 ENCOUNTER — Other Ambulatory Visit: Payer: Self-pay

## 2017-11-10 ENCOUNTER — Encounter (HOSPITAL_COMMUNITY): Payer: Self-pay | Admitting: Emergency Medicine

## 2017-11-10 ENCOUNTER — Emergency Department (HOSPITAL_COMMUNITY)
Admission: EM | Admit: 2017-11-10 | Discharge: 2017-11-10 | Disposition: A | Discharge status: Home / Self Care | Payer: Medicaid Other | Attending: Emergency Medicine | Admitting: Emergency Medicine

## 2017-11-10 DIAGNOSIS — F17200 Nicotine dependence, unspecified, uncomplicated: Secondary | ICD-10-CM | POA: Insufficient documentation

## 2017-11-10 DIAGNOSIS — I10 Essential (primary) hypertension: Secondary | ICD-10-CM | POA: Insufficient documentation

## 2017-11-10 DIAGNOSIS — Z4801 Encounter for change or removal of surgical wound dressing: Secondary | ICD-10-CM | POA: Insufficient documentation

## 2017-11-10 DIAGNOSIS — Z5189 Encounter for other specified aftercare: Secondary | ICD-10-CM

## 2017-11-10 MED ORDER — ENSURE COMPLETE PO LIQD: 237.0000 mL | Freq: Two times a day (BID) | ORAL | 0 refills | Status: DC

## 2017-11-10 NOTE — Discharge Instructions (Addendum)
Thank you for allowing me to provide your care today in the emergency department.  The incision site around your wound does not have any signs of infection today.  You can cut the large dressings into smaller pieces to cover the wound while he has a small amount of drainage.  Change the dressing as frequently as needed.  Try to ensure that the tape does not get stuck in the glue over your incision site.  If the dressing sticks, you can use a small amount of cool water to gently pull the dressing away.  If you develop thick, mucus-like drainage from the wound, or if the skin around the incision site gets red, hot, or swollen to the touch, or if you develop fever chills, return to the emergency department for re-evaluation.

## 2017-11-10 NOTE — Telephone Encounter (Addendum)
Met with the patient when he came to the clinic today to inquire about getting a prescription for ensure filled.  He explained that he has medicaid and was told by his [facility] to come to [facility].  Explained to him that [employer] does not fill  prescriptions for supplements  and medicaid generally will not cover supplements when they are taken orally, only when administered via tube.    This CM called [employer] and spoke to [place] to inquire if medicaid will pay for ensure supplement taken orally. She stated that as per their nutrition department the supplements are only covered when given orally for patients who are under 39 years old with certain medical conditions.   This information was shared with the patient. He was instructed to contact his surgeon if he feels he is experiencing any problems as a result of his surgery.

## 2017-11-10 NOTE — ED Notes (Signed)
Pt verbalized understanding of discharge instructions and denies any further questions at this time.   

## 2017-11-10 NOTE — ED Provider Notes (Signed)
[facility] EMERGENCY DEPARTMENT Provider Note   CSN: [id number] Arrival date & time: 11/10/17  0759     History   Chief Complaint Chief Complaint  Patient presents with  . Wound Check    HPI Jonathon Obrien is a 52 y.o. male with a history of back pain and HTN who presents to the emergency department with a chief complaint of wound check.  He had a laparoscopic cholecystectomy on Nov 01, 2017.  He was discharged home and return to the emergency department on May 25 for abdominal pain and oozing from his infraumbilical incision.  The patient was discharged home with a gauze dressing placed over the wound.  He returns today because he states that his discharge instructions from his surgery clearly stated not to put a dressing over the wound because it had glue.  His wife also reports that the gauze dressing from his ED visit became stuck into the glue of his infraumbilical incision and they were unable to remove it.  They attempted to remove the dressing with warm water at home without success.  He has no other complaints at this time including a fever, chills, purulent drainage, redness, or warmth around the wound.  He endorses a small amount of bloody and serosanguineous drainage.  He is scheduled to follow-up with general surgery in a week and a half.  The history is provided by the patient. No language interpreter was used.    Past Medical History:  Diagnosis Date  . Back pain   . Hypertension     Patient Active Problem List   Diagnosis Date Noted  . Cystic duct calculus 10/31/2017  . Essential hypertension 10/28/2017  . Cocaine abuse (HCC) 10/28/2017  . Chest pain 10/28/2017  . Dehydration   . Acute cholecystitis   . Aortic atherosclerosis (HCC) 09/12/2017  . Pulmonary nodule in R middle lobe 09/12/2017  . Aortic dilatation (HCC) of anterior arch 09/12/2017  . Polysubstance abuse (HCC)  09/12/2017  . Tobacco use 09/12/2017    Past Surgical History:    Procedure Laterality Date  . CHOLECYSTECTOMY N/A 10/31/2017   Procedure: LAPAROSCOPIC CHOLECYSTECTOMY;  Surgeon: Violeta Gelinas, MD;  Location: [facility] OR;  Service: General;  Laterality: N/A;  . HERNIA REPAIR    . KNEE SURGERY          Home Medications    Prior to Admission medications   Medication Sig Start Date End Date Taking? Authorizing Provider  calcium carbonate (TUMS EX) 750 MG chewable tablet Chew 2 tablets by mouth as needed for heartburn.    [provider]  diphenhydrAMINE (BENADRYL) 25 MG tablet Take 25 mg by mouth every 6 (six) hours as needed for allergies.    [provider]  feeding supplement, ENSURE COMPLETE, (ENSURE COMPLETE) LIQD Take 237 mLs by mouth 2 (two) times daily between meals. 11/10/17   McDonald, Mia A, PA-C  lisinopril (PRINIVIL,ZESTRIL) 20 MG tablet Take 20 mg by mouth daily.    [provider]  Multiple Vitamin (MULTIVITAMIN WITH MINERALS) TABS tablet Take 1 tablet by mouth daily.    [provider]  naproxen sodium (ALEVE) 220 MG tablet Take 440 mg by mouth daily as needed (pain).     [provider]    Family History Family History  Problem Relation Age of Onset  . Hypertension Mother   . Hypertension Father   . Diabetes Mellitus II Father     Social History Social History   Tobacco Use  .  Smoking status: Current Every Day Smoker  . Smokeless tobacco: Never Used  Substance Use Topics  . Alcohol use: Never    Frequency: Never  . Drug use: Yes    Types: Cocaine     Allergies   Patient has no known allergies.   Review of Systems Review of Systems  Constitutional: Negative for appetite change, chills and fever.  Respiratory: Negative for shortness of breath.   Cardiovascular: Negative for chest pain.  Gastrointestinal: Negative for abdominal pain.  Genitourinary: Negative for dysuria.  Musculoskeletal: Negative for back pain.  Skin: Positive for wound. Negative for rash.   Allergic/Immunologic: Negative for immunocompromised state.  Neurological: Negative for headaches.  Psychiatric/Behavioral: Negative for confusion.     Physical Exam Updated Vital Signs BP (!) 145/99 (BP Location: Right Arm)   Pulse 86   Temp 98 F (36.7 C) (Oral)   Resp 16   SpO2 100%   Physical Exam  Constitutional: He appears well-developed.  HENT:  Head: Normocephalic.  Eyes: Conjunctivae are normal.  Neck: Neck supple.  Cardiovascular: Normal rate and regular rhythm.  No murmur heard. Pulmonary/Chest: Effort normal.  Abdominal: Soft. He exhibits no distension.  Neurological: He is alert.  Skin: Skin is warm and dry.  Well-healing incisions noted to the abdomen.  Of note, there is an infraumbilical incision with Dermabond still in place.  No active drainage from the wound on my evaluation.  I am unable to express drainage from the wound with palpation.  No surrounding erythema, edema, or warmth.  There is minimal ecchymosis.  Psychiatric: His behavior is normal.  Nursing note and vitals reviewed.    ED Treatments / Results  Labs (all labs ordered are listed, but only abnormal results are displayed) Labs Reviewed - No data to display  EKG None  Radiology No results found.  Procedures Procedures (including critical care time)  Medications Ordered in ED Medications - No data to display   Initial Impression / Assessment and Plan / ED Course  I have reviewed the triage vital signs and the nursing notes.  Pertinent labs & imaging results that were available during my care of the patient were reviewed by me and considered in my medical decision making (see chart for details).     52 year old male with a history of hypertension and back pain presenting for wound recheck.  He had a laparoscopic cholecystectomy on May 18.  He was seen on May 25 for drainage from the wound.  After an extensive work-up, he was discharged home with a dressing over the lower abdomen.   He returns today because he was unable to remove the dressing because it was stuck to his incision site.  Prior to my examination, nursing staff removed the dressing from the infra umbilical wound.  On his abdominal exam, there are well-healing incisions to the lower abdomen.  Of note, there is no surrounding erythema, edema, or warmth to the infraumbilical incision.  No active or drainage able to be expressed with palpation.  A nonadherent bandage has been applied to the incision site.  I have encouraged the patient to call his surgeon's office if he has any additional issues.  He is also requesting a prescription for his insurer refills of which I provided.  Strict return precautions given.  The patient is hemodynamically stable and in no acute distress.  He is safe for discharge to home with follow-up to surgery at this time.  Final Clinical Impressions(s) / ED Diagnoses   Final diagnoses:  Encounter for wound re-check    ED Discharge Orders        Ordered    feeding supplement, ENSURE COMPLETE, (ENSURE COMPLETE) LIQD  2 times daily between meals     11/10/17 1039       McDonald, Mia A, PA-C 11/10/17 1303    Tilden Fossa, MD 11/11/17 [phone]

## 2017-11-10 NOTE — ED Triage Notes (Signed)
Pt had surgery on 5/18 and here today because he is concerned about a bandaged that is struck on his lower mid incision.

## 2017-11-10 NOTE — ED Notes (Signed)
ED Provider at bedside. 

## 2017-11-30 ENCOUNTER — Other Ambulatory Visit: Payer: Self-pay

## 2017-11-30 ENCOUNTER — Ambulatory Visit (INDEPENDENT_AMBULATORY_CARE_PROVIDER_SITE_OTHER): Payer: Medicaid Other | Admitting: Physician Assistant

## 2017-11-30 ENCOUNTER — Encounter (INDEPENDENT_AMBULATORY_CARE_PROVIDER_SITE_OTHER): Payer: Self-pay | Admitting: Physician Assistant

## 2017-11-30 VITALS — BP 130/88 | HR 72 | Temp 98.0°F | Ht 77.5 in | Wt 163.6 lb

## 2017-11-30 DIAGNOSIS — I10 Essential (primary) hypertension: Secondary | ICD-10-CM

## 2017-11-30 DIAGNOSIS — Z5189 Encounter for other specified aftercare: Secondary | ICD-10-CM | POA: Diagnosis not present

## 2017-11-30 DIAGNOSIS — Z09 Encounter for follow-up examination after completed treatment for conditions other than malignant neoplasm: Secondary | ICD-10-CM | POA: Diagnosis not present

## 2017-11-30 MED ORDER — LISINOPRIL 20 MG PO TABS: 20.0000 mg | ORAL_TABLET | Freq: Every day | ORAL | 1 refills | Status: DC

## 2017-11-30 NOTE — Progress Notes (Signed)
Subjective:  Patient ID: Jonathon Obrien, male    DOB: 09-13-1965  Age: 52 y.o. MRN: [id number]  CC: hospital f/u  HPI Jonathon Obrien is a 52 y.o. male with a medical history of HTN and laparoscopic cholecystectomy presents on hospital f/u for ED visit on 11/07/17 and 11/10/17.  Went to ED on 11/07/17 due to bleeding from wound site s/p laparoscopic cholecystectomy on 11/01/17. Went to ED on 11/10/17 for wound recheck. Says he is now much better and has no more serosanguinous fluid draining and is also without pain.     Needs refill of lisinopril. Ran out yesterday. BP 130/88 mmHg today. No side effects from Lisinopril. Does not endorse CP, palpitations, SOB, HA, tingling, numbness, weakness, paralysis, presyncope, syncope, abdominal pain, f/c/n/v, rash, or GI/GU sxs.        Outpatient Medications Prior to Visit  Medication Sig Dispense Refill  . diphenhydrAMINE (BENADRYL) 25 MG tablet Take 25 mg by mouth every 6 (six) hours as needed for allergies.    . Multiple Vitamin (MULTIVITAMIN WITH MINERALS) TABS tablet Take 1 tablet by mouth daily.    . feeding supplement, ENSURE COMPLETE, (ENSURE COMPLETE) LIQD Take 237 mLs by mouth 2 (two) times daily between meals. (Patient not taking: Reported on 11/30/2017) 30 Bottle 0  . lisinopril (PRINIVIL,ZESTRIL) 20 MG tablet Take 20 mg by mouth daily.    . naproxen sodium (ALEVE) 220 MG tablet Take 440 mg by mouth daily as needed (pain).     . calcium carbonate (TUMS EX) 750 MG chewable tablet Chew 2 tablets by mouth as needed for heartburn.     No facility-administered medications prior to visit.      ROS Review of Systems  Constitutional: Negative for chills, fever and malaise/fatigue.  Eyes: Negative for blurred vision.  Respiratory: Negative for shortness of breath.   Cardiovascular: Negative for chest pain and palpitations.  Gastrointestinal: Negative for abdominal pain and nausea.  Genitourinary: Negative for dysuria and hematuria.   Musculoskeletal: Negative for joint pain and myalgias.  Skin: Negative for rash.  Neurological: Negative for tingling and headaches.  Psychiatric/Behavioral: Negative for depression. The patient is not nervous/anxious.     Objective:  BP 130/88 (BP Location: Left Arm, Patient Position: Sitting, Cuff Size: Normal)   Pulse 72   Temp 98 F (36.7 C) (Oral)   Ht 6' 5.5" (1.969 m)   Wt 163 lb 9.6 oz (74.2 kg)   SpO2 100%   BMI 19.15 kg/m   BP/Weight 11/30/2017 11/10/2017 11/07/2017  Systolic BP 130 145 129  Diastolic BP 88 99 78  Wt. (Lbs) 163.6 - -  BMI 19.15 - -      Physical Exam  Constitutional: He is oriented to person, place, and time.  Well developed, thin, NAD, polite  HENT:  Head: Normocephalic and atraumatic.  Eyes: No scleral icterus.  Neck: Normal range of motion. Neck supple. No thyromegaly present.  Cardiovascular: Normal rate, regular rhythm and normal heart sounds.  Pulmonary/Chest: Effort normal and breath sounds normal.  Abdominal: Soft. Bowel sounds are normal. He exhibits no distension and no mass. There is no tenderness. There is no rebound and no guarding. No hernia.  Musculoskeletal: He exhibits no edema.  Neurological: He is alert and oriented to person, place, and time.  Skin: Skin is warm and dry. No rash noted. No erythema. No pallor.  No wound drainage or dehicense. Wound clean and dry.  Psychiatric: He has a normal mood and affect. His behavior is normal. Thought  content normal.  Vitals reviewed.    Assessment & Plan:    1. Hypertension, unspecified type - Controlled in clinic today. - Refill lisinopril 20 mg one tab po qday.   2. Hospital discharge follow-up - Notes reviewed. Wound properly cared for and is now healing very well.  3. Visit for wound check - No dehisence. Wound is healing very well.     Follow-up: Return in about 3 months (around 03/02/2018) for annual physical.   Loletta Specteroger David Gomez PA

## 2017-11-30 NOTE — Patient Instructions (Signed)

## 2018-01-21 ENCOUNTER — Emergency Department (HOSPITAL_COMMUNITY)
Admission: EM | Admit: 2018-01-21 | Discharge: 2018-01-21 | Disposition: A | Discharge status: Home / Self Care | Payer: Medicaid Other | Attending: Emergency Medicine | Admitting: Emergency Medicine

## 2018-01-21 ENCOUNTER — Encounter (HOSPITAL_COMMUNITY): Payer: Self-pay | Admitting: *Deleted

## 2018-01-21 ENCOUNTER — Emergency Department (HOSPITAL_BASED_OUTPATIENT_CLINIC_OR_DEPARTMENT_OTHER): Payer: Medicaid Other

## 2018-01-21 ENCOUNTER — Other Ambulatory Visit: Payer: Self-pay

## 2018-01-21 DIAGNOSIS — M7918 Myalgia, other site: Secondary | ICD-10-CM | POA: Insufficient documentation

## 2018-01-21 DIAGNOSIS — F17210 Nicotine dependence, cigarettes, uncomplicated: Secondary | ICD-10-CM | POA: Insufficient documentation

## 2018-01-21 DIAGNOSIS — L0291 Cutaneous abscess, unspecified: Secondary | ICD-10-CM

## 2018-01-21 DIAGNOSIS — M79609 Pain in unspecified limb: Secondary | ICD-10-CM

## 2018-01-21 DIAGNOSIS — M7989 Other specified soft tissue disorders: Secondary | ICD-10-CM | POA: Insufficient documentation

## 2018-01-21 DIAGNOSIS — I10 Essential (primary) hypertension: Secondary | ICD-10-CM | POA: Diagnosis not present

## 2018-01-21 MED ORDER — SULFAMETHOXAZOLE-TRIMETHOPRIM 800-160 MG PO TABS: 1.0000 | ORAL_TABLET | Freq: Two times a day (BID) | ORAL | 0 refills | Status: AC

## 2018-01-21 MED ORDER — SULFAMETHOXAZOLE-TRIMETHOPRIM 800-160 MG PO TABS: 1.0000 | ORAL_TABLET | Freq: Two times a day (BID) | ORAL | 0 refills | Status: DC

## 2018-01-21 MED ORDER — NAPROXEN 500 MG PO TABS: 500.0000 mg | ORAL_TABLET | Freq: Two times a day (BID) | ORAL | 0 refills | Status: DC
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
## 2018-01-21 NOTE — Progress Notes (Signed)
Preliminary notes--Left lower extremity venous duplex exam completed. Negative for DVT.  Jonathon Obrien (RDMS RVT) 01/21/18 2:43 PM

## 2018-01-21 NOTE — Discharge Instructions (Addendum)
Please read attached information. If you experience any new or worsening signs or symptoms please return to the emergency room for evaluation. Please follow-up with your primary care provider or specialist as discussed. Please use medication prescribed only as directed and discontinue taking if you have any concerning signs or symptoms.   °

## 2018-01-27 ENCOUNTER — Encounter (HOSPITAL_COMMUNITY): Payer: Self-pay | Admitting: Family Medicine

## 2018-01-27 ENCOUNTER — Ambulatory Visit (HOSPITAL_COMMUNITY)
Admission: EM | Admit: 2018-01-27 | Discharge: 2018-01-27 | Disposition: A | Discharge status: Home / Self Care | Payer: Medicaid Other | Attending: Family Medicine | Admitting: Family Medicine

## 2018-01-27 DIAGNOSIS — L03119 Cellulitis of unspecified part of limb: Secondary | ICD-10-CM | POA: Diagnosis not present

## 2018-01-27 DIAGNOSIS — S060X0A Concussion without loss of consciousness, initial encounter: Secondary | ICD-10-CM | POA: Diagnosis not present

## 2018-01-27 DIAGNOSIS — S86812A Strain of other muscle(s) and tendon(s) at lower leg level, left leg, initial encounter: Secondary | ICD-10-CM

## 2018-01-27 MED ORDER — DICLOFENAC SODIUM 75 MG PO TBEC: 75.0000 mg | DELAYED_RELEASE_TABLET | Freq: Two times a day (BID) | ORAL | 0 refills | Status: DC
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
## 2018-01-27 NOTE — Discharge Instructions (Signed)
You need to make an appointment with the specialists noted below.  They will evaluate the leg strain and concussion.  Also, they will help you get your TENS unit replaced.
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
## 2018-02-02 ENCOUNTER — Encounter (HOSPITAL_COMMUNITY): Payer: Self-pay | Admitting: Emergency Medicine

## 2018-02-02 ENCOUNTER — Ambulatory Visit (HOSPITAL_COMMUNITY)
Admission: EM | Admit: 2018-02-02 | Discharge: 2018-02-02 | Disposition: A | Discharge status: Home / Self Care | Payer: Medicaid Other | Attending: Nurse Practitioner | Admitting: Nurse Practitioner

## 2018-02-02 DIAGNOSIS — M545 Low back pain, unspecified: Secondary | ICD-10-CM

## 2018-02-02 DIAGNOSIS — R51 Headache: Secondary | ICD-10-CM | POA: Diagnosis not present

## 2018-02-02 DIAGNOSIS — R519 Headache, unspecified: Secondary | ICD-10-CM

## 2018-02-02 MED ORDER — TRAMADOL HCL 50 MG PO TABS: 50.0000 mg | ORAL_TABLET | Freq: Four times a day (QID) | ORAL | 0 refills | Status: AC | PRN
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
## 2018-02-21 ENCOUNTER — Other Ambulatory Visit: Payer: Self-pay

## 2018-02-21 ENCOUNTER — Encounter (HOSPITAL_COMMUNITY): Payer: Self-pay

## 2018-02-21 ENCOUNTER — Emergency Department (HOSPITAL_COMMUNITY)
Admission: EM | Admit: 2018-02-21 | Discharge: 2018-02-21 | Disposition: A | Discharge status: Home / Self Care | Payer: Medicaid Other | Attending: Emergency Medicine | Admitting: Emergency Medicine

## 2018-02-21 DIAGNOSIS — M545 Low back pain, unspecified: Secondary | ICD-10-CM

## 2018-02-21 DIAGNOSIS — F17200 Nicotine dependence, unspecified, uncomplicated: Secondary | ICD-10-CM | POA: Diagnosis not present

## 2018-02-21 DIAGNOSIS — Z9049 Acquired absence of other specified parts of digestive tract: Secondary | ICD-10-CM | POA: Diagnosis not present

## 2018-02-21 DIAGNOSIS — F1410 Cocaine abuse, uncomplicated: Secondary | ICD-10-CM | POA: Diagnosis not present

## 2018-02-21 DIAGNOSIS — G8929 Other chronic pain: Secondary | ICD-10-CM | POA: Diagnosis not present

## 2018-02-21 DIAGNOSIS — I10 Essential (primary) hypertension: Secondary | ICD-10-CM | POA: Diagnosis not present

## 2018-02-21 DIAGNOSIS — Z79899 Other long term (current) drug therapy: Secondary | ICD-10-CM | POA: Diagnosis not present

## 2018-02-21 DIAGNOSIS — I2510 Atherosclerotic heart disease of native coronary artery without angina pectoris: Secondary | ICD-10-CM | POA: Diagnosis not present

## 2018-02-21 NOTE — ED Triage Notes (Signed)
Pt states that he has lower back pain and needs a tens unit.

## 2018-02-21 NOTE — Discharge Instructions (Addendum)
Continue taking your home medicines as prescribed.  I have written a prescription for a TENS unit for you.  Follow-up with your primary care physician or an orthopedic spine physician for reevaluation of your symptoms.  Return to the emergency department if any concerning signs or symptoms develop such as fevers, weakness, loss of control of bowel or bladder, or numbness to the genital region.  If your blood pressure (BP) was elevated on multiple readings during this visit above 130 for the top number or above 80 for the bottom number, please have this repeated by your primary care provider within one month. You can also check your blood pressure when you are out at a pharmacy or grocery store. Many have machines that will check your blood pressure.  If your blood pressure remains elevated, please follow-up with your PCP.
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
## 2018-02-21 NOTE — ED Notes (Signed)
Pt stable and ambulatory for discharge, states understanding follow up.  

## 2018-02-24 ENCOUNTER — Other Ambulatory Visit: Payer: Self-pay

## 2018-02-24 ENCOUNTER — Encounter (INDEPENDENT_AMBULATORY_CARE_PROVIDER_SITE_OTHER): Payer: Self-pay | Admitting: Physician Assistant

## 2018-02-24 ENCOUNTER — Ambulatory Visit (INDEPENDENT_AMBULATORY_CARE_PROVIDER_SITE_OTHER): Payer: Medicaid Other | Admitting: Physician Assistant

## 2018-02-24 VITALS — BP 153/82 | HR 75 | Temp 97.9°F | Ht 77.5 in | Wt 178.2 lb

## 2018-02-24 DIAGNOSIS — M545 Low back pain, unspecified: Secondary | ICD-10-CM

## 2018-02-24 DIAGNOSIS — I10 Essential (primary) hypertension: Secondary | ICD-10-CM | POA: Diagnosis not present

## 2018-02-24 DIAGNOSIS — M25572 Pain in left ankle and joints of left foot: Secondary | ICD-10-CM

## 2018-02-24 MED ORDER — LISINOPRIL 20 MG PO TABS: 40.0000 mg | ORAL_TABLET | Freq: Every day | ORAL | 2 refills | Status: DC

## 2018-02-24 MED ORDER — GABAPENTIN 300 MG PO CAPS: 300.0000 mg | ORAL_CAPSULE | Freq: Three times a day (TID) | ORAL | 3 refills | Status: AC

## 2018-02-24 MED ORDER — MELOXICAM 15 MG PO TABS: 15.0000 mg | ORAL_TABLET | Freq: Every day | ORAL | 0 refills | Status: DC

## 2018-02-24 MED ORDER — GABAPENTIN 300 MG PO CAPS: 300.0000 mg | ORAL_CAPSULE | Freq: Every day | ORAL | 0 refills | Status: DC

## 2018-02-24 NOTE — Patient Instructions (Addendum)
I advise that you continue treatment with your orthopedic specialist at [facility].    Back Pain, Adult Back pain is very common. The pain often gets better over time. The cause of back pain is usually not dangerous. Most people can learn to manage their back pain on their own. Follow these instructions at home: Watch your back pain for any changes. The following actions may help to lessen any pain you are feeling:  Stay active. Start with short walks on flat ground if you can. Try to walk farther each day.  Exercise regularly as told by your doctor. Exercise helps your back heal faster. It also helps avoid future injury by keeping your muscles strong and flexible.  Do not sit, drive, or stand in one place for more than 30 minutes.  Do not stay in bed. Resting more than 1-2 days can slow down your recovery.  Be careful when you bend or lift an object. Use good form when lifting: ? Bend at your knees. ? Keep the object close to your body. ? Do not twist.  Sleep on a firm mattress. Lie on your side, and bend your knees. If you lie on your back, put a pillow under your knees.  Take medicines only as told by your doctor.  Put ice on the injured area. ? Put ice in a plastic bag. ? Place a towel between your skin and the bag. ? Leave the ice on for 20 minutes, 2-3 times a day for the first 2-3 days. After that, you can switch between ice and heat packs.  Avoid feeling anxious or stressed. Find good ways to deal with stress, such as exercise.  Maintain a healthy weight. Extra weight puts stress on your back.  Contact a doctor if:  You have pain that does not go away with rest or medicine.  You have worsening pain that goes down into your legs or buttocks.  You have pain that does not get better in one week.  You have pain at night.  You lose weight.  You have a fever or chills. Get help right away if:  You cannot control when you poop (bowel movement) or pee  (urinate).  Your arms or legs feel weak.  Your arms or legs lose feeling (numbness).  You feel sick to your stomach (nauseous) or throw up (vomit).  You have belly (abdominal) pain.  You feel like you may pass out (faint). This information is not intended to replace advice given to you by your health care provider. Make sure you discuss any questions you have with your health care provider. Document Released: 11/19/2007 Document Revised: 11/08/2015 Document Reviewed: 10/04/2013 Elsevier Interactive Patient Education  [employer].
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
## 2018-03-10 ENCOUNTER — Ambulatory Visit (INDEPENDENT_AMBULATORY_CARE_PROVIDER_SITE_OTHER): Payer: Medicaid Other

## 2018-03-10 ENCOUNTER — Ambulatory Visit (HOSPITAL_COMMUNITY): Payer: Medicaid Other

## 2018-03-10 ENCOUNTER — Ambulatory Visit (HOSPITAL_COMMUNITY)
Admission: EM | Admit: 2018-03-10 | Discharge: 2018-03-10 | Disposition: A | Discharge status: Home / Self Care | Payer: Medicaid Other | Attending: Family Medicine | Admitting: Family Medicine

## 2018-03-10 DIAGNOSIS — M79631 Pain in right forearm: Secondary | ICD-10-CM

## 2018-03-10 DIAGNOSIS — R454 Irritability and anger: Secondary | ICD-10-CM

## 2018-03-10 DIAGNOSIS — M79605 Pain in left leg: Secondary | ICD-10-CM
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
## 2018-03-27 ENCOUNTER — Emergency Department (HOSPITAL_COMMUNITY)
Admission: EM | Admit: 2018-03-27 | Discharge: 2018-03-27 | Disposition: A | Discharge status: Home / Self Care | Payer: Medicaid Other | Attending: Emergency Medicine | Admitting: Emergency Medicine

## 2018-03-27 ENCOUNTER — Other Ambulatory Visit: Payer: Self-pay

## 2018-03-27 ENCOUNTER — Encounter (HOSPITAL_COMMUNITY): Payer: Self-pay | Admitting: *Deleted

## 2018-03-27 DIAGNOSIS — F17210 Nicotine dependence, cigarettes, uncomplicated: Secondary | ICD-10-CM | POA: Insufficient documentation

## 2018-03-27 DIAGNOSIS — Z590 Homelessness: Secondary | ICD-10-CM | POA: Diagnosis not present

## 2018-03-27 DIAGNOSIS — B353 Tinea pedis: Secondary | ICD-10-CM | POA: Insufficient documentation

## 2018-03-27 DIAGNOSIS — Z79899 Other long term (current) drug therapy: Secondary | ICD-10-CM | POA: Insufficient documentation

## 2018-03-27 DIAGNOSIS — I10 Essential (primary) hypertension: Secondary | ICD-10-CM | POA: Diagnosis not present

## 2018-03-27 DIAGNOSIS — M79671 Pain in right foot: Secondary | ICD-10-CM | POA: Diagnosis present

## 2018-03-27 MED ORDER — CLOTRIMAZOLE 1 % EX CREA: TOPICAL_CREAM | CUTANEOUS | 0 refills | Status: DC

## 2018-03-27 NOTE — Discharge Instructions (Addendum)
Use the cream to the affected area of the right foot. Keep your next appointment with your doctor on 03/30/18 for recheck.

## 2018-03-27 NOTE — ED Triage Notes (Signed)
Pt brought in by [employer] d/t pt with multiple complaints.
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
## 2018-04-28 ENCOUNTER — Emergency Department (HOSPITAL_COMMUNITY)
Admission: EM | Admit: 2018-04-28 | Discharge: 2018-04-28 | Disposition: A | Discharge status: Home / Self Care | Payer: Medicaid Other | Attending: Emergency Medicine | Admitting: Emergency Medicine

## 2018-04-28 ENCOUNTER — Encounter (HOSPITAL_COMMUNITY): Payer: Self-pay | Admitting: Emergency Medicine

## 2018-04-28 DIAGNOSIS — F17210 Nicotine dependence, cigarettes, uncomplicated: Secondary | ICD-10-CM | POA: Diagnosis not present

## 2018-04-28 DIAGNOSIS — R2242 Localized swelling, mass and lump, left lower limb: Secondary | ICD-10-CM | POA: Insufficient documentation

## 2018-04-28 DIAGNOSIS — I10 Essential (primary) hypertension: Secondary | ICD-10-CM | POA: Diagnosis not present

## 2018-04-28 DIAGNOSIS — M79662 Pain in left lower leg: Secondary | ICD-10-CM | POA: Diagnosis present

## 2018-04-28 DIAGNOSIS — Z79899 Other long term (current) drug therapy: Secondary | ICD-10-CM | POA: Insufficient documentation

## 2018-04-28 DIAGNOSIS — B353 Tinea pedis: Secondary | ICD-10-CM | POA: Insufficient documentation

## 2018-04-28 DIAGNOSIS — R609 Edema, unspecified: Secondary | ICD-10-CM

## 2018-04-28 MED ORDER — CLOTRIMAZOLE 1 % EX CREA: TOPICAL_CREAM | CUTANEOUS | 0 refills | Status: AC

## 2018-04-28 NOTE — ED Notes (Signed)
ED Provider at bedside. 

## 2018-04-28 NOTE — ED Provider Notes (Signed)
[facility] EMERGENCY [facility] Provider Note   CSN: [id number] Arrival date & time: 04/28/18  2301     History   Chief Complaint Chief Complaint  Patient presents with  . Foot Pain    HPI Jonathon Obrien is a 52 y.o. male.  Patient to ED with multiple issues. He first reports he has been trying to obtain compression stocking for his left lower leg as prescribed by his orthopedist but no where in [place] carries what he needs. He reports the one place he found ordered the stocking for him and that it would be available by tomorrow. He is here tonight because he wants the stocking now. He also complain of a recurrent infection on his right foot. He underwent a procedure he states 9 or 10 days ago by a podiatrist that drained the area but there is a recurrent blister. No purulence, drainage, pain or itching. He is scheduled for recheck with podiatry on the 19th (in 6 days) but felt he needed topical cream that he had been prescribed previously that was effective on the symptoms.   The history is provided by the patient. No language interpreter was used.  Foot Pain     Past Medical History:  Diagnosis Date  . Back pain   . Hypertension     Patient Active Problem List   Diagnosis Date Noted  . Cystic duct calculus 10/31/2017  . Essential hypertension 10/28/2017  . Cocaine abuse (HCC) 10/28/2017  . Chest pain 10/28/2017  . Dehydration   . Acute cholecystitis   . Aortic atherosclerosis (HCC) 09/12/2017  . Pulmonary nodule in R middle lobe 09/12/2017  . Aortic dilatation (HCC) of anterior arch 09/12/2017  . Polysubstance abuse (HCC)  09/12/2017  . Tobacco use 09/12/2017    Past Surgical History:  Procedure Laterality Date  . CHOLECYSTECTOMY N/A 10/31/2017   Procedure: LAPAROSCOPIC CHOLECYSTECTOMY;  Surgeon: Violeta Gelinashompson, Burke, MD;  [facility];  Service: General;  Laterality: N/A;  . HERNIA REPAIR    . KNEE SURGERY          Home Medications     Prior to Admission medications   Medication Sig Start Date End Date Taking? Authorizing Provider  clotrimazole (LOTRIMIN) 1 % cream Apply to affected area 2 times daily 03/27/18   Elpidio AnisUpstill, Denesia Donelan, PA-C  diphenhydrAMINE (BENADRYL) 25 MG tablet Take 25 mg by mouth every 6 (six) hours as needed for allergies.    [provider]  divalproex (DEPAKOTE) 500 MG DR tablet Take 500 mg by mouth 2 (two) times daily.    [provider]  gabapentin (NEURONTIN) 300 MG capsule Take 1 capsule (300 mg total) by mouth 3 (three) times daily. 02/24/18   Loletta SpecterGomez, Roger David, PA-C  lisinopril (PRINIVIL,ZESTRIL) 20 MG tablet Take 2 tablets (40 mg total) by mouth daily. 02/24/18   Loletta SpecterGomez, Roger David, PA-C  meloxicam (MOBIC) 15 MG tablet Take 1 tablet (15 mg total) by mouth daily. 02/24/18   Loletta SpecterGomez, Roger David, PA-C  Multiple Vitamin (MULTIVITAMIN WITH MINERALS) TABS tablet Take 1 tablet by mouth daily.    [provider]  OLANZapine (ZYPREXA) 7.5 MG tablet Take 7.5 mg by mouth at bedtime.    [provider]  sertraline (ZOLOFT) 25 MG tablet Take 25 mg by mouth every morning.    [provider]  traZODone (DESYREL) 50 MG tablet Take 50 mg by mouth at bedtime.    [provider]    Family History Family History  Problem Relation  Age of Onset  . Hypertension Mother   . Hypertension Father   . Diabetes Mellitus II Father     Social History Social History   Tobacco Use  . Smoking status: Current Every Day Smoker  . Smokeless tobacco: Never Used  Substance Use Topics  . Alcohol use: Never    Frequency: Never  . Drug use: Yes    Types: Cocaine     Allergies   Patient has no known allergies.   Review of Systems Review of Systems  Constitutional: Negative for fever.  Musculoskeletal:       See HPI.  Skin:       See HPI.     Physical Exam Updated Vital Signs BP (!) 171/88 (BP Location: Right Arm)   Pulse 88   Temp 97.8 F (36.6 C) (Oral)    Resp 16   SpO2 98%   Physical Exam  Constitutional: He is oriented to person, place, and time. He appears well-developed and well-nourished.  Neck: Normal range of motion.  Pulmonary/Chest: Effort normal.  Musculoskeletal: Normal range of motion.  Minimal swelling of left lower extremity without redness, ulceration or skin breakdown. Right foot has area of thickened skin from the 2nd through 4th toes. There is a blister present on the lateral 2nd toe that is fluid filled with clear fluid. Denuded skin on plantar surface at base of 2nd through 4th toes c/w tinea.  Neurological: He is alert and oriented to person, place, and time.  Skin: Skin is warm and dry.  Psychiatric: His affect is blunt. His speech is rapid and/or pressured and tangential.  Difficult to direct to topic of symptoms.     ED Treatments / Results  Labs (all labs ordered are listed, but only abnormal results are displayed) Labs Reviewed - No data to display  EKG None  Radiology No results found.  Procedures Procedures (including critical care time)  Medications Ordered in ED Medications - No data to display   Initial Impression / Assessment and Plan / ED Course  I have reviewed the triage vital signs and the nursing notes.  Pertinent labs & imaging results that were available during my care of the patient were reviewed by me and considered in my medical decision making (see chart for details).     Patient here with request to fill his prescription for compression hose. These have been ordered and, per patient, should be available tomorrow. He should pursue obtaining these tomorrow as scheduled.   Lotrimin cream provided. He is encouraged to keep his scheduled recheck appointment with podiatry next week.   Final Clinical Impressions(s) / ED Diagnoses   Final diagnoses:  None   1. Tinea pedis 2. LE edema  ED Discharge Orders    None       Elpidio Anis, Cordelia Poche 04/28/18 2340    Mesner, Barbara Cower,  MD 04/29/18 [phone]

## 2018-04-28 NOTE — ED Notes (Signed)
Patient verbalizes understanding of discharge instructions. Opportunity for questioning and answers were provided. I informed pt of the 24 hour pharmacy nearby. Armband removed by staff, pt discharged from ED ambulatory.

## 2018-04-28 NOTE — Discharge Instructions (Addendum)
The compression stocking is not available through the hospital tonight and you should contact the place that ordered them last week tomorrow to inquire as to delivery time. Use Lotrimin on the affected area of the right foot and keep your appointment with your doctor as scheduled for next week.

## 2018-04-28 NOTE — ED Triage Notes (Signed)
Pt complaining of Right foot pain. Pt states he has an infection on his right foot

## 2018-07-19 ENCOUNTER — Other Ambulatory Visit: Payer: Self-pay | Admitting: Internal Medicine

## 2018-07-19 DIAGNOSIS — I10 Essential (primary) hypertension: Secondary | ICD-10-CM

## 2018-07-19 MED ORDER — LISINOPRIL 20 MG PO TABS: 40.0000 mg | ORAL_TABLET | Freq: Every day | ORAL | 1 refills | Status: DC

## 2018-09-21 ENCOUNTER — Telehealth: Payer: Self-pay | Admitting: Internal Medicine

## 2018-09-21 DIAGNOSIS — I10 Essential (primary) hypertension: Secondary | ICD-10-CM

## 2018-09-21 NOTE — Telephone Encounter (Signed)
Patient has not had any recent visit or blood work . Needs appt

## 2018-09-27 ENCOUNTER — Other Ambulatory Visit: Payer: Self-pay | Admitting: Primary Care

## 2018-09-27 DIAGNOSIS — I10 Essential (primary) hypertension: Secondary | ICD-10-CM

## 2018-09-27 MED ORDER — LISINOPRIL 20 MG PO TABS: 40.0000 mg | ORAL_TABLET | Freq: Every day | ORAL | 0 refills | Status: DC

## 2018-09-27 NOTE — Telephone Encounter (Signed)
1) Medication(s) Requested (by name):lisinopril (PRINIVIL,ZESTRIL) 20 MG tablet [[id number]]    2) Pharmacy of Choice:[facility]  3) Special Requests:   Approved medications will be sent to the pharmacy, we will reach out if there is an issue.  Requests made after 3pm may not be addressed until the following business day!  If a patient is unsure of the name of the medication(s) please note and ask patient to call back when they are able to provide all info, do not send to responsible party until all information is available!

## 2018-09-27 NOTE — Telephone Encounter (Signed)
NEEDs LABS No more refills

## 2018-09-28 ENCOUNTER — Ambulatory Visit: Payer: Medicaid Other | Admitting: Primary Care

## 2018-09-30 ENCOUNTER — Ambulatory Visit: Payer: Medicaid Other | Admitting: Primary Care

## 2018-09-30 ENCOUNTER — Ambulatory Visit: Payer: Medicaid Other | Attending: Primary Care

## 2018-09-30 ENCOUNTER — Other Ambulatory Visit: Payer: Self-pay

## 2018-09-30 DIAGNOSIS — I10 Essential (primary) hypertension: Secondary | ICD-10-CM

## 2018-09-30 MED ORDER — LISINOPRIL 20 MG PO TABS: 20.0000 mg | ORAL_TABLET | Freq: Two times a day (BID) | ORAL | 3 refills | Status: DC

## 2018-09-30 MED ORDER — LISINOPRIL 20 MG PO TABS: 40.0000 mg | ORAL_TABLET | Freq: Every day | ORAL | 0 refills | Status: DC

## 2018-09-30 MED ORDER — ADULT MULTIVITAMIN W/MINERALS CH: 1.0000 | ORAL_TABLET | Freq: Every day | ORAL | 3 refills | Status: AC

## 2018-09-30 NOTE — Progress Notes (Signed)
Patients blood was drawn and lisinopril was refilled.

## 2018-10-01 LAB — COMPREHENSIVE METABOLIC PANEL
ALT: 17 IU/L (ref 0–44)
AST: 19 IU/L (ref 0–40)
Albumin/Globulin Ratio: 2.2 (ref 1.2–2.2)
Albumin: 4.4 g/dL (ref 3.8–4.9)
Alkaline Phosphatase: 53 IU/L (ref 39–117)
BUN/Creatinine Ratio: 17 (ref 9–20)
BUN: 16 mg/dL (ref 6–24)
Bilirubin Total: 0.2 mg/dL (ref 0.0–1.2)
CO2: 25 mmol/L (ref 20–29)
Calcium: 9.1 mg/dL (ref 8.7–10.2)
Chloride: 105 mmol/L (ref 96–106)
Creatinine, Ser: 0.95 mg/dL (ref 0.76–1.27)
GFR calc Af Amer: 107 mL/min/{1.73_m2} (ref >59)
GFR calc non Af Amer: 92 mL/min/{1.73_m2} (ref >59)
Globulin, Total: 2 g/dL (ref 1.5–4.5)
Glucose: 101 mg/dL — ABNORMAL HIGH (ref 65–99)
Potassium: 4.1 mmol/L (ref 3.5–5.2)
Sodium: 145 mmol/L — ABNORMAL HIGH (ref 134–144)
Total Protein: 6.4 g/dL (ref 6.0–8.5)

## 2018-10-04 MED FILL — LISINOPRIL 20 MG TAB: 20 | 30 days supply | Qty: 60 | Fill #0 | Status: TO

## 2018-10-10 ENCOUNTER — Encounter (HOSPITAL_COMMUNITY): Payer: Self-pay

## 2018-10-10 ENCOUNTER — Other Ambulatory Visit: Payer: Self-pay

## 2018-10-10 ENCOUNTER — Ambulatory Visit (HOSPITAL_COMMUNITY)
Admission: EM | Admit: 2018-10-10 | Discharge: 2018-10-10 | Disposition: A | Discharge status: Home / Self Care | Payer: Medicaid Other | Attending: Physician Assistant | Admitting: Physician Assistant

## 2018-10-10 DIAGNOSIS — I10 Essential (primary) hypertension: Secondary | ICD-10-CM | POA: Diagnosis not present

## 2018-10-10 DIAGNOSIS — M778 Other enthesopathies, not elsewhere classified: Secondary | ICD-10-CM

## 2018-10-10 DIAGNOSIS — L989 Disorder of the skin and subcutaneous tissue, unspecified: Secondary | ICD-10-CM

## 2018-10-10 MED ORDER — KETOROLAC TROMETHAMINE 30 MG/ML IJ SOLN
30.0000 mg | Freq: Once | INTRAMUSCULAR | Status: AC
Start: 2018-10-10 — End: 2018-10-10
  Administered 2018-10-10: 30 mg via INTRAMUSCULAR

## 2018-10-10 MED ORDER — KETOROLAC TROMETHAMINE 30 MG/ML IJ SOLN
INTRAMUSCULAR | Status: AC
  Filled 2018-10-10: qty 1

## 2018-10-10 MED ORDER — PREDNISONE 50 MG PO TABS: 50.0000 mg | ORAL_TABLET | Freq: Every day | ORAL | 0 refills | Status: DC

## 2018-10-10 NOTE — ED Triage Notes (Signed)
Pt cc left arm pain. X 4 days. Swelling off and on. Pt has a knot on his right foot on the top. X 3 days.

## 2018-10-10 NOTE — Discharge Instructions (Signed)
No alarming signs on exam. Toradol injection in office today. Start prednisone as directed. Ice compress to the elbow. Follow up with PCP for further evaluation if symptoms not improving.
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
## 2018-10-27 NOTE — Progress Notes (Addendum)
COVID Hotel Screening performed. Temperature, PHQ-9, and need for medical care and medications assessed. Patient complained of arthritis in his left arm and elbow that was treated at [facility] a week or more ago. He claims that he was given an injection and 5 days worth of pain medication and it has run out. This is his second day without medications. The provider at Urgent Care instructed him to reach out to "Dr. Marcelino Duster at [facility]." He states that he has left messages, but has not received an call back and he is getting frustrated. Referral made to [facility].  Carlyle Basques RN MSN

## 2018-12-07 NOTE — Progress Notes (Signed)
This encounter was created in error - please disregard.

## 2019-02-11 ENCOUNTER — Other Ambulatory Visit: Payer: Self-pay

## 2019-02-11 ENCOUNTER — Ambulatory Visit (INDEPENDENT_AMBULATORY_CARE_PROVIDER_SITE_OTHER): Payer: Medicaid Other | Admitting: Primary Care

## 2019-02-11 ENCOUNTER — Encounter (INDEPENDENT_AMBULATORY_CARE_PROVIDER_SITE_OTHER): Payer: Self-pay | Admitting: Primary Care

## 2019-02-11 DIAGNOSIS — Z5900 Homelessness unspecified: Secondary | ICD-10-CM

## 2019-02-11 DIAGNOSIS — Z720 Tobacco use: Secondary | ICD-10-CM

## 2019-02-11 DIAGNOSIS — I10 Essential (primary) hypertension: Secondary | ICD-10-CM

## 2019-02-11 DIAGNOSIS — H539 Unspecified visual disturbance: Secondary | ICD-10-CM | POA: Diagnosis not present

## 2019-02-11 DIAGNOSIS — K0889 Other specified disorders of teeth and supporting structures: Secondary | ICD-10-CM

## 2019-02-11 MED ORDER — LISINOPRIL 20 MG PO TABS: 20.0000 mg | ORAL_TABLET | Freq: Two times a day (BID) | ORAL | 3 refills | Status: AC

## 2019-02-11 MED ORDER — SERTRALINE HCL 25 MG PO TABS: 25.0000 mg | ORAL_TABLET | ORAL | 3 refills | Status: AC
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
## 2019-03-23 ENCOUNTER — Ambulatory Visit (INDEPENDENT_AMBULATORY_CARE_PROVIDER_SITE_OTHER): Payer: Medicaid Other | Admitting: Primary Care

## 2019-03-25 ENCOUNTER — Ambulatory Visit (INDEPENDENT_AMBULATORY_CARE_PROVIDER_SITE_OTHER): Payer: Medicaid Other | Admitting: Primary Care
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
## 2019-09-22 ENCOUNTER — Other Ambulatory Visit (INDEPENDENT_AMBULATORY_CARE_PROVIDER_SITE_OTHER): Payer: Self-pay | Admitting: Primary Care
